# Patient Record
Sex: Female | Born: 1945 | Race: White | Hispanic: No | Marital: Single
Health system: Southern US, Community
[De-identification: ages and names within clinical notes are randomized; demographics above are authoritative.]

---

## 1998-04-26 ENCOUNTER — Encounter: Admission: RE | Admit: 1998-04-26 | Discharge: 1998-07-25 | Payer: Self-pay | Admitting: Family Medicine

## 1998-11-04 ENCOUNTER — Encounter: Admission: RE | Admit: 1998-11-04 | Discharge: 1999-02-02 | Payer: Self-pay | Admitting: Family Medicine

## 2007-07-08 ENCOUNTER — Other Ambulatory Visit: Admission: RE | Admit: 2007-07-08 | Discharge: 2007-07-08 | Payer: Self-pay | Admitting: Family Medicine

## 2007-08-27 ENCOUNTER — Ambulatory Visit: Payer: Self-pay | Admitting: Internal Medicine

## 2007-09-23 ENCOUNTER — Ambulatory Visit: Payer: Self-pay | Admitting: Internal Medicine

## 2007-09-29 ENCOUNTER — Ambulatory Visit: Payer: Self-pay | Admitting: Internal Medicine

## 2011-05-08 NOTE — Letter (Signed)
September 29, 2007    Stacie Acres. Cliffton Asters, M.D.  [address]  [place], [place] [address]   RE:  Shelia, Fuentes  MRN:  [id number]  /  DOB:  [birth year]   Dear Dr. Cliffton Asters:   It was a pleasure to see Shelia Fuentes again in followup today.  As you  know, I saw her on August 27, 2007, for cough.  In the interim, she  states that her cough has completely disappeared.  She states that 2  weeks after visiting with me, her cough disappeared/resolved.  She  thinks this is because she cut down the exposures at her mother's house  by going there less frequently, sleeping there less frequently and also  by getting rid of the dust.  She still states that she is taking the  Symbicort, however.  In terms of dyspnea, she says she is still dyspneic  when climbing a flight of stairs or walking up a hill.   PAST MEDICAL HISTORY:  Unchanged from September 2008.   PAST SURGICAL HISTORY:  Unchanged from September 2008.   CURRENT MEDICATIONS:  Nexium.   ALLERGIES:  No known drug allergies.   REVIEW OF SYSTEMS:  This is positive for a blocked nose on the right  side.  Other than this, she complains of her thumbs hurting which she  thinks is due to her taking Symbicort.  Her appetite is less and she has  lost 10 pounds of weight.  She feels tired all the time and she also has  insomnia.   SOCIAL HISTORY:  Mom's house is cleaner now and she also has less  exposure to the environment in her mom's house.   FAMILY HISTORY:  No change since August 27, 2007.   PHYSICAL EXAMINATION:  VITAL SIGNS:  Today in the clinic, weight 236.8  pounds, temperature 98.4, blood pressure 130/78, pulse 99, pulse  oximetry 98% on room air.  GENERAL:  Exam is essentially unchanged from August 27, 2007.  She is  obese, sitting comfortably in the exam room.  CENTRAL NERVOUS SYSTEM:  Alert and oriented x3.  Speech and ambulation  are normal.  HEENT:  Obese neck, no neck nodes.  Right nose seems congested.  This  was also  noted last time.  LUNGS:  No cough during exam, no crackles.  Normal breath sounds.  ABDOMEN:  Soft, nontender.  CARDIOVASCULAR:  Normal heart sounds.  Regular rate and rhythm.  EXTREMITIES:  No clubbing, cyanosis or edema.  SKIN:  Intact.  BACK:  Normal.   LABORATORY DATA AND X-RAY FINDINGS:  Chest x-ray done today is  essentially normal.  This is mine and Haematologist.  On a 6-  minute walk test, she completed 350 m in 6 minutes.  Pulses oximetry was  98% at rest and was 96% at the end of 6 minutes.  Heart rate went from  102 to 129, blood pressure rose from 130/88 to 150/92.  Fatigue scale  remained at 3, both at rest and exertion.  Dyspnea scale was 0 at rest  and 0.5 at the end of exertion.  She completed the test without any  complaints.   Pulmonary function tests with full set of lung function tests available.  This was done on September 23, 2007.  Pulmonary function test was  essentially normal except for DLCO of 20.4 mL/mmHg per minute which is  77% of predicted, however, the hemoglobin is unavailable for correction.   ASSESSMENT/PLAN:  1. Cough, now resolved.  Looking back, I suspect cough was related to      the exposure at her mom's house.  Cough is now resolved with      continued Symbicort and cutting down the exposure at the mother's      house.  Therefore, I suspect this indeed was an element of cough      variant asthma or reactive airway.  I will ask her to continue to      take the Symbicort for another 6 weeks and then stop it to see if      the cough recurs.  2. Stuffed nose.  I have told her to reinstate taking her nasal      steroids and have given her a sample of Asthmanex.  If it is still      present, I would recommend that you refer her to ENT surgery.  3. Shortness of breath.  She has normal PFTs, normal 6-minute walk      test and no chest pain on exertion.  Therefore, I suspect shortness      of breath is related to obesity and  deconditioning.  I have advised      her to start an exercise program.  4. Positive review of systems.  She has multiple other complaints like      difficulty using her thumbs, weight loss, decreased appetite and      insomnia.  I have asked her to follow up with you for those      complaints.  The only change I made was to start her on Advair      instead of      Symbicort because her thumbs were hurting.  5. Health maintenance.  I gave her flu shot vaccine today.  6. Return to followup.  I told her to come to the clinic if she feels      her symptoms recur or are persistent.  Otherwise, she is discharged      from this clinic.    Sincerely,      Kalman Shan, MD  Electronically Signed    MR/MedQ  DD: 09/29/2007  DT: 09/30/2007  Job #: [id number]

## 2011-05-08 NOTE — Letter (Signed)
August 27, 2007    Stacie Acres. Cliffton Asters, M.D.  [address]  [place], [place] [address]   RE:  Shelia Fuentes, Shelia Fuentes  MRN:  [id number]  /  DOB:  Sep 29, 1946   Dear Dr. Cliffton Asters:   Thank you for referring Shelia Fuentes.  As you know, she is a pleasant  65 year old woman who has persistent cough since December, 2007.  This  cough is present day and night and she says that the cough is so loud  that it has office workers worried and that for the last 3 months it has  a deep tuberculous sound.  She feels she is unable to catch her breath  after cough because the cough is so severe.  The cough is exacerbated by  heat, exercise, walking, climbing one flight of stairs or exertion, is  associated with some dizziness, is generally a dry cough but for the  last few days she has had some scanty yellow sputum.  There is also some  associated wheezing at night.   She denies any upper respiratory illness, viral infection before the  onset of the cough.  She does get dyspneic but usually only after she  coughs.   She denies any fever, postnasal drip, hemoptysis, edema, loss of  consciousness.   PAST MEDICAL HISTORY:  1. Obesity.  2. Low backache.  3. 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   PAST SURGICAL HISTORY:  Status post tonsillectomy at the age of 65.   CURRENT MEDICATIONS:  Symbicort inhaler, Nexium in May, 2008, Flonase  since June, 2008, and prednisone with Levaquin in June, 2008, for the  chronic cough.  Of all of these, only prednisone seems to have worked a  little bit, the rest did not work.  She also had amoxicillin and cough  syrup in March, 2008, but currently she is only on Symbicort since July,  2008.   DRUG ALLERGIES:  No known drug allergies.   SOCIAL HISTORY:  1. She is single.  2. She has never smoked.  3. She lives alone.  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  5. She moved into her mother's old 64 year old in 11-20-2006,      after her  death.  She says she has spent that time trying to clean      the house.  The carpets are all dirty and old, but she denies any      molds or fungus but she states that her cough, her symptoms started      after she moved into this house and symptoms are particularly worse      when she is in her Mom's bedroom.   FAMILY HISTORY:  1. Maternal uncles had heart disease.  2. Father had esophageal cancer.  3. Brother had prostate cancer.   REVIEW OF SYSTEMS:  She admits to a lot of snoring but denies any  excessive daytime somnolence.  She has low backache for a long time with  numbness in the right foot.  Other than that, review of systems is  positive for current respiratory symptoms.  Rest of review of systems  are negative and documented in the questionnaire.   PHYSICAL EXAM:  Weight 247.6 pounds, temperature 97.8, blood pressure  126/80, pulse of 91, pulse of 95% on room air.  EXAM FINDINGS:  This is an obese woman seated comfortably in the exam  room.  She is pleasant.  CENTRAL  NERVOUS SYSTEM:  Alert and oriented x3.  Speech and ambulation  are normal.  HEENT EXAM:  Obese neck but Mallampati Classification is Class 3.  Her  nose seems congested but neck is supple, no neck nodes.  RESPIRATORY EXAM:  She coughs intermittently during the exam.  Air entry  is equal.  There is a possibility of bibasal crackles, but I am not  sure.  ABDOMEN EXAM:  Abdomen is obese, soft, nontender, no organomegaly.  CARDIOVASCULAR:  Normal heart sounds, regular rate and rhythm.  EXTREMITIES:  No cyanosis, no clubbing, no pedal edema.  SKIN:  Skin is intact.  BACK EXAM:  Normal.   LABORATORY EVALUATION:  Pulmonary function test, she has never had one.  Chest x-ray in May, 2008, is reported as normal, it is here with her  today but is of poor quality.   ASSESSMENT AND PLAN:  In summary, this is a 65 year old nonsmoker who  has had a troublesome cough ever since moving in to her Mom's dusty old   house.  Cough is exacerbated by being in the bedroom and by heat and  exercise and some exertion.  It sounds like she cough variant asthma.  Other common etiologies include gastroesophageal reflux disease and  postnasal drip; however, empiric treatment for all of these, including  Nexium, Flonase, prednisone and Symbicort, have not resolved the cough.  The only thing is that prednisone seems to have helped the cough a bit.   I think it is best to establish a cause for the cough.  I will start  with full pulmonary function test with bronchodilator response, DLCO and  also get a high resolution CT scan of the chest.  Will also subject her  to a 6-minute walk test to better clarify the dyspnea.  I will see her  after end of all this evaluation.    Sincerely,      Kalman Shan, MD  Electronically Signed    MR/MedQ  DD: 08/31/2007  DT: 08/31/2007  Job #: [id number]

## 2020-02-11 ENCOUNTER — Emergency Department (HOSPITAL_COMMUNITY): Payer: Medicare Other

## 2020-02-11 ENCOUNTER — Emergency Department (HOSPITAL_COMMUNITY)
Admission: EM | Admit: 2020-02-11 | Discharge: 2020-02-11 | Disposition: A | Discharge status: Home / Self Care | Payer: Medicare Other | Attending: Emergency Medicine | Admitting: Emergency Medicine

## 2020-02-11 ENCOUNTER — Encounter (HOSPITAL_COMMUNITY): Payer: Self-pay

## 2020-02-11 ENCOUNTER — Other Ambulatory Visit: Payer: Self-pay

## 2020-02-11 DIAGNOSIS — W01198A Fall on same level from slipping, tripping and stumbling with subsequent striking against other object, initial encounter: Secondary | ICD-10-CM | POA: Insufficient documentation

## 2020-02-11 DIAGNOSIS — Y9301 Activity, walking, marching and hiking: Secondary | ICD-10-CM | POA: Insufficient documentation

## 2020-02-11 DIAGNOSIS — S42201A Unspecified fracture of upper end of right humerus, initial encounter for closed fracture: Secondary | ICD-10-CM | POA: Diagnosis not present

## 2020-02-11 DIAGNOSIS — Y92017 Garden or yard in single-family (private) house as the place of occurrence of the external cause: Secondary | ICD-10-CM | POA: Insufficient documentation

## 2020-02-11 DIAGNOSIS — Y999 Unspecified external cause status: Secondary | ICD-10-CM | POA: Diagnosis not present

## 2020-02-11 DIAGNOSIS — S4991XA Unspecified injury of right shoulder and upper arm, initial encounter: Secondary | ICD-10-CM | POA: Diagnosis present

## 2020-02-11 MED ORDER — HYDROCODONE-ACETAMINOPHEN 5-325 MG PO TABS
2.0000 | ORAL_TABLET | Freq: Once | ORAL | Status: AC
  Administered 2020-02-11: 21:00:00 2 via ORAL
  Filled 2020-02-11: qty 2

## 2020-02-11 MED ORDER — HYDROCODONE-ACETAMINOPHEN 5-325 MG PO TABS: 2.0000 | ORAL_TABLET | Freq: Four times a day (QID) | ORAL | 0 refills | Status: AC | PRN

## 2020-02-11 MED ORDER — HYDROCODONE-ACETAMINOPHEN 5-325 MG PO TABS: 1.0000 | ORAL_TABLET | Freq: Once | ORAL | Status: DC

## 2020-02-11 NOTE — ED Provider Notes (Signed)
[facility] EMERGENCY DEPARTMENT Provider Note  CSN: [id number] Arrival date & time:        History Chief Complaint  Patient presents with  . Arm Pain   Shelia Fuentes is a 74 y.o. female.  The history is provided by the patient and the EMS personnel.  Fall This is a new problem. The current episode started yesterday (6 PM last night). The problem has not changed since onset.Pertinent negatives include no chest pain, no abdominal pain, no headaches and no shortness of breath. Associated symptoms comments: Right shoulder and upper arm pain. Exacerbated by: Movement, palpation. Relieved by: Immobilization. Treatments tried: 200 mcg of fentanyl via EMS. The treatment provided significant relief.  Patient fell last night at 6 PM, struck her right shoulder on the wall then fell to the ground, without head injury, loss of consciousness, headache, neck pain, nausea, vomiting.  The patient is not on any blood thinners.  Was on the ground for approximately 4 hours before being able to get into the home, friend came to check her this morning, was noted to have significant bruising of the right upper extremity in addition to significant pain so EMS was called for transport to the emergency department.     History reviewed. No pertinent past medical history.  There are no problems to display for this patient.   History reviewed. No pertinent surgical history.   OB History   No obstetric history on file.     History reviewed. No pertinent family history.  Social History   Tobacco Use  . Smoking status: Not on file  Substance Use Topics  . Alcohol use: Not on file  . Drug use: Not on file    Home Medications Prior to Admission medications   Medication Sig Start Date End Date Taking? Authorizing Provider  HYDROcodone-acetaminophen (NORCO) 5-325 MG tablet Take 2 tablets by mouth every 6 (six) hours as needed for severe pain. 02/11/20   Sherwood Gambler, MD     Allergies    Patient has no allergy information on record.  Review of Systems   Review of Systems  Respiratory: Negative for shortness of breath.   Cardiovascular: Negative for chest pain.  Gastrointestinal: Negative for abdominal pain.  Musculoskeletal: Negative for back pain and neck pain.  Neurological: Negative for syncope and headaches.  All other systems reviewed and are negative.   Physical Exam Updated Vital Signs BP 127/68 (BP Location: Left Wrist)   Pulse 79   Temp 98.8 F (37.1 C) (Oral)   Resp 16   Ht 5\' 3"  (1.6 m)   Wt 108.9 kg   SpO2 95%   BMI 42.51 kg/m   Physical Exam Vitals and nursing note reviewed.  Constitutional:      General: She is not in acute distress.    Appearance: She is well-developed.  HENT:     Head: Normocephalic and atraumatic.     Right Ear: Tympanic membrane normal.     Left Ear: Tympanic membrane normal.     Mouth/Throat:     Mouth: Mucous membranes are moist.     Pharynx: Oropharynx is clear.  Eyes:     Conjunctiva/sclera: Conjunctivae normal.     Pupils: Pupils are equal, round, and reactive to light.  Neck:     Comments: No c-spine ttp Cardiovascular:     Rate and Rhythm: Normal rate and regular rhythm.     Pulses: Normal pulses.     Heart sounds: No murmur.  Pulmonary:  Effort: Pulmonary effort is normal. No respiratory distress.     Breath sounds: Normal breath sounds.  Abdominal:     Palpations: Abdomen is soft.     Tenderness: There is no abdominal tenderness.  Musculoskeletal:        General: Tenderness (right upper arm, right clavicle) and deformity (right shoulder) present.     Cervical back: Neck supple.  Skin:    General: Skin is warm and dry.     Findings: Bruising (right upper arm) present.  Neurological:     Mental Status: She is alert.     Sensory: No sensory deficit.     Motor: No weakness.  Psychiatric:        Mood and Affect: Mood normal.        Behavior: Behavior normal.     ED  Results / Procedures / Treatments   Labs (all labs ordered are listed, but only abnormal results are displayed) Labs Reviewed - No data to display  EKG None  Radiology DG Clavicle Right  Result Date: 02/11/2020 CLINICAL DATA:  74 year old female status post fall last night. EXAM: RIGHT CLAVICLE - 2+ VIEWS COMPARISON:  Right humerus series earlier today. FINDINGS: Comminuted proximal right humerus fracture as seen earlier. Right clavicle appears intact. Negative visible right ribs and chest. 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 IMPRESSION: 1. Right clavicle appears intact. 2. Comminuted proximal humerus fracture. Electronically Signed   By: Odessa Fleming M.D.   On: 02/11/2020 18:15   DG Humerus Right  Result Date: 02/11/2020 CLINICAL DATA:  Fall. EXAM: RIGHT HUMERUS - 2+ VIEW COMPARISON:  No prior. FINDINGS: Angulated comminuted fracture of the proximal portion of the right humerus is present. Right shoulder appears to be normally located. No evidence of AC separation. IMPRESSION: Angulated comminuted fracture of the proximal portion of the right humerus. Electronically Signed   By: Maisie Fus  Register   On: 02/11/2020 17:01    Procedures Procedures (including critical care time)  Medications Ordered in ED Medications  HYDROcodone-acetaminophen (NORCO/VICODIN) 5-325 MG per tablet 2 tablet (2 tablets Oral Given 02/11/20 2056)    ED Course  I have reviewed the triage vital signs and the nursing notes.  Pertinent labs & imaging results that were available during my care of the patient were reviewed by me and considered in my medical decision making (see chart for details).    MDM Rules/Calculators/A&P                       Here for right upper arm pain after ground-level fall, mechanical, no syncope.  No head injury, no loss of consciousness, not on blood thinners, no signs of trauma to the head or neck, do not suspect intracranial bleed or cervical fracture at this time  and will defer imaging of these areas.  X-ray with evidence of comminuted angulated right proximal humerus fracture, neuro-motor-vascular intact.  Clavicle x-ray unremarkable, patient able to ambulate, no other signs of trauma on exam.  Fracture discussed with Dr. Carola Frost (ortho) who will see in the clinic next week, placed in sling to the right upper extremity.  Recheck the patient who is having worsening pain, given p.o. Norco x2, will prescribe Norco for pain relief at home.  [facility] [place] prescription drug monitoring program was reviewed.  Strict return precautions given, discharged in stable condition.   Final Clinical Impression(s) / ED Diagnoses Final diagnoses:  Closed fracture of proximal end of right humerus, unspecified fracture morphology, initial encounter  Rx / DC Orders ED Discharge Orders         Ordered    HYDROcodone-acetaminophen (NORCO) 5-325 MG tablet  Every 6 hours PRN     02/11/20 1837           Gargi Berch, [place], MD 02/11/20 2103    Pricilla Loveless, MD 02/11/20 2253

## 2020-02-11 NOTE — Progress Notes (Signed)
Orthopedic Tech Progress Note Patient Details:  Shelia Fuentes May 15, 1946 [id number]  Ortho Devices Type of Ortho Device: Sling immobilizer Ortho Device/Splint Location: rue Ortho Device/Splint Interventions: Ordered, Application, Adjustment   Post Interventions Patient Tolerated: Well Instructions Provided: Care of device, Adjustment of device   Trinna Post 02/11/2020, 8:27 PM

## 2020-02-11 NOTE — ED Triage Notes (Signed)
Pt was "winterizing" house last night when she fell and hit her arm on the side of the building. Took her four hours to get inside, when friend visited her today they decided she should come to ER. Initial BP was 190/100, after fentanyl came down to approximately 140/70 for EMS. All other VSS. AOx4, denies hitting head, LOC, not on thinners.

## 2020-02-11 NOTE — ED Notes (Signed)
Ortho tech paged, will come and apply sling immobilizer

## 2020-02-23 ENCOUNTER — Ambulatory Visit: Payer: Medicare Other | Attending: Orthopedic Surgery | Admitting: Physical Therapy

## 2020-02-23 ENCOUNTER — Other Ambulatory Visit: Payer: Self-pay

## 2020-02-23 ENCOUNTER — Encounter: Payer: Self-pay | Admitting: Physical Therapy

## 2020-02-23 DIAGNOSIS — M25511 Pain in right shoulder: Secondary | ICD-10-CM

## 2020-02-23 DIAGNOSIS — R252 Cramp and spasm: Secondary | ICD-10-CM | POA: Diagnosis present

## 2020-02-23 DIAGNOSIS — M25611 Stiffness of right shoulder, not elsewhere classified: Secondary | ICD-10-CM | POA: Diagnosis present

## 2020-02-23 NOTE — Patient Instructions (Signed)
Access Code: [email or web address]  URL: [email or web address]  Date: 02/23/2020  Prepared by: Stacie Glaze   Exercises Seated Shoulder Shrugs - 10 reps - 2 sets - 3 hold - 3x daily - 7x weekly Seated Scapular Retraction - 10 reps - 2 sets - 3 hold - 2x daily - 7x weekly Seated Shoulder Pendulum Exercise - 10 reps - 1 sets - 1 hold - 3x daily - 7x weekly Seated Forearm Supination Pronation - 10 reps - 1 sets - 2 hold - 2x daily - 7x weekly Seated Shoulder Flexion Towel Slide at Table Top - 10 reps - 2 sets - 3 hold - 2x daily - 7x weekly Seated Biceps Curl - 10 reps - 1 sets - 2 hold - 2x daily - 7x weekly

## 2020-02-23 NOTE — Therapy (Signed)
[facility] [address] [place], [place], [address] Phone: [phone]   Fax:  [phone]  Physical Therapy Evaluation  Patient Details  Name: Shelia Fuentes MRN: [id number] Date of Birth: 14-Jul-1946 Referring Provider (PT): [place]   Encounter Date: 02/23/2020  PT End of Session - 02/23/20 1522    Visit Number  1    Date for PT Re-Evaluation  04/24/20    PT Start Time  7322    PT Stop Time  1523    PT Time Calculation (min)  49 min    Activity Tolerance  Patient limited by pain    Behavior During Therapy  [facility] for tasks assessed/performed       History reviewed. No pertinent past medical history.  History reviewed. No pertinent surgical history.  There were no vitals filed for this visit.   Subjective Assessment - 02/23/20 1437    Subjective  Patient reports that about the 17th of February, she was outside and got tripped up on vines and fell.  The next day she went to th ED.  The x-rays showed a comminuted fracture of the proximal right humerus.  She was put in a sling.    Pertinent History  patient denies    Patient Stated Goals  have normal ROM, strength less difficulty with ADL's    Currently in Pain?  Yes    Pain Score  0-No pain    Pain Location  Arm    Pain Orientation  Right    Pain Descriptors / Indicators  Aching;Sore    Pain Type  Acute pain    Pain Onset  1 to 4 weeks ago    Pain Frequency  Intermittent    Aggravating Factors   movements pain up to 8/10    Pain Relieving Factors  not moving and medication pain can be 0/10    Effect of Pain on Daily Activities  limits everything         [facility] PT Assessment - 02/23/20 0001      Assessment   Medical Diagnosis  s/p right shoulder upper humerus fracture    Referring Provider (PT)  Handy    Onset Date/Surgical Date  02/10/20    Hand Dominance  Right    Prior Therapy  no      Precautions   Precautions  None      Balance Screen   Has the  patient fallen in the past 6 months  Yes    How many times?  1    Has the patient had a decrease in activity level because of a fear of falling?   No    Is the patient reluctant to leave their home because of a fear of falling?   No      Home Environment   Additional Comments  does housework, does some yardwork and gardening      Prior Function   Level of Independence  Independent    Vocation  Retired    Leisure  no exercise in over a year      Mining engineer Comments  fwd head, rounded shoulders, right shoulder elevated and guarded      ROM / Strength   AROM / PROM / Strength  PROM      PROM   Overall PROM Comments  forearm WNL's, elbow extension WNL's flexion is painful and I feel some crepitus in the right shoulder with this 3/10  PROM Assessment Site  Shoulder    Right/Left Shoulder  Right    Right Shoulder Flexion  50 Degrees   pain at end range   Right Shoulder ABduction  30 Degrees    Right Shoulder Internal Rotation  40 Degrees    Right Shoulder External Rotation  0 Degrees   pain     Palpation   Palpation comment  she is very tight and tender in the right upper trap, tender in the right upper arm, she has significant bruising in the right upper arm from mid humerus to the elbow.                Objective measurements completed on examination: See above findings.                PT Short Term Goals - 02/23/20 1527      PT SHORT TERM GOAL #1   Title  independent with initial HEP    Time  2    Period  Weeks    Status  New        PT Long Term Goals - 02/23/20 1527      PT LONG TERM GOAL #1   Title  understand postrure and body mechanics    Time  8    Period  Weeks    Status  New      PT LONG TERM GOAL #2   Title  decrease pain 50%    Time  8    Period  Weeks    Status  New      PT LONG TERM GOAL #3   Title  report able to dress and do hair without difficulty    Time  8    Period  Weeks    Status  New       PT LONG TERM GOAL #4   Title  increase flexion to 120 degrees    Time  8    Period  Weeks    Status  New             Plan - 02/23/20 1523    Clinical Impression Statement  Patient had a fall 02/10/20 and sustained a proximal humerus fracture.  She has been in a sling since that time, she reports that any movements cause pain a 5-6/10, has difficulty getting up from sitting or turning in bed, she is right hand dominant.  She has significant bruising in the right upper arm.  She is tight and tender in the right upper trap from guarding.  Forearm and wrist motions are WNL's, when we did biceps curls this caused pain and there was some crepitus in the right shoulder.  PROM was very limited with pain, flexion was better than other motions but still very limited    Stability/Clinical Decision Making  Evolving/Moderate complexity    Clinical Decision Making  Low    Rehab Potential  Good    PT Frequency  2x / week    PT Duration  8 weeks    PT Treatment/Interventions  ADLs/Self Care Home Management;Cryotherapy;Electrical Stimulation;Moist Heat;Therapeutic activities;Therapeutic exercise;Manual techniques;Patient/family education    PT Next Visit Plan  I gave HEPbut told her to go slow until she seems MD tomorrow, she has a lot of crepitus with motions so I do have concerns about RC damage and cartilage damage    Consulted and Agree with Plan of Care  Patient       Patient will benefit from skilled therapeutic intervention  in order to improve the following deficits and impairments:  Pain, Improper body mechanics, Increased muscle spasms, Postural dysfunction, Impaired UE functional use, Decreased strength, Decreased range of motion, Decreased activity tolerance  Visit Diagnosis: Acute pain of right shoulder - Plan: PT plan of care cert/re-cert  Stiffness of right shoulder, not elsewhere classified - Plan: PT plan of care cert/re-cert  Cramp and spasm - Plan: PT plan of care  cert/re-cert     Problem List There are no problems to display for this patient.   Jearld Lesch., PT 02/23/2020, 3:30 PM  [facility] [address] [facility] [address] [place], [place], [address] Phone: [phone]   Fax:  [phone]  Name: Shelia Fuentes MRN: [id number] Date of Birth: [birth year]

## 2020-03-04 ENCOUNTER — Ambulatory Visit: Payer: Medicare Other | Admitting: Physical Therapy

## 2020-03-04 ENCOUNTER — Other Ambulatory Visit: Payer: Self-pay

## 2020-03-04 DIAGNOSIS — M25611 Stiffness of right shoulder, not elsewhere classified: Secondary | ICD-10-CM

## 2020-03-04 DIAGNOSIS — M25511 Pain in right shoulder: Secondary | ICD-10-CM

## 2020-03-04 DIAGNOSIS — R252 Cramp and spasm: Secondary | ICD-10-CM

## 2020-03-04 NOTE — Therapy (Signed)
[place] [address] [place], [place], [address] Phone: [phone]   Fax:  [phone]  Physical Therapy Treatment  Patient Details  Name: Shelia Fuentes MRN: [id number] Date of Birth: [birth year] Referring Provider (PT): Marcelino Scot   Encounter Date: 03/04/2020  PT End of Session - 03/04/20 1133    Visit Number  2    Date for PT Re-Evaluation  04/24/20    PT Start Time  1053    PT Stop Time  1761    PT Time Calculation (min)  38 min       No past medical history on file.  No past surgical history on file.  There were no vitals filed for this visit.  Subjective Assessment - 03/04/20 1128    Subjective  saw MD he said it was healing, f/u 3/17    Currently in Pain?  Yes    Pain Score  5     Pain Location  Shoulder    Pain Orientation  Right                       [facility] Adult PT Treatment/Exercise - 03/04/20 0001      Exercises   Exercises  Shoulder;Neck      Shoulder Exercises: Standing   Other Standing Exercises  finger ladder 5 x flex PTA assist   ball rolling on mat PTA assist   Other Standing Exercises  bicep curl, IR and shld ext with cane 10 x   shruggs and backward rolls 10 each     Shoulder Exercises: ROM/Strengthening   UBE (Upper Arm Bike)  10 x fwd 10x back with PTA assist      Manual Therapy   Manual Therapy  Passive ROM    Manual therapy comments  very guarded and resisted    Passive ROM  to pt tolerance and educ pt friend on gentle PROM at home             PT Education - 03/04/20 1132    Education Details  PROM with friend, standing shruggs,backward rolls and bicep curls. educ in relaxation on trap and letting arm hang at side    Person(s) Educated  Patient    Methods  Explanation    Comprehension  Verbalized understanding;Returned demonstration;Tactile cues required       PT Short Term Goals - 02/23/20 1527      PT SHORT TERM GOAL #1   Title  independent with  initial HEP    Time  2    Period  Weeks    Status  New        PT Long Term Goals - 02/23/20 1527      PT LONG TERM GOAL #1   Title  understand postrure and body mechanics    Time  8    Period  Weeks    Status  New      PT LONG TERM GOAL #2   Title  decrease pain 50%    Time  8    Period  Weeks    Status  New      PT LONG TERM GOAL #3   Title  report able to dress and do hair without difficulty    Time  8    Period  Weeks    Status  New      PT LONG TERM GOAL #4   Title  increase flexion to 120 degrees  Time  8    Period  Weeks    Status  New            Plan - 03/04/20 1134    Clinical Impression Statement  pt tolerated initial ther ex fait, PTA and cuing needed as pt is guarded and pain limited. educ friend in PROM at home, increased HEP and educ pt in relating trap as sh etends to hike shld in quarded position    PT Treatment/Interventions  ADLs/Self Care Home Management;Cryotherapy;Electrical Stimulation;Moist Heat;Therapeutic activities;Therapeutic exercise;Manual techniques;Patient/family education    PT Next Visit Plan  check HEP, PROM and MD f/u 03/09/20       Patient will benefit from skilled therapeutic intervention in order to improve the following deficits and impairments:  Pain, Improper body mechanics, Increased muscle spasms, Postural dysfunction, Impaired UE functional use, Decreased strength, Decreased range of motion, Decreased activity tolerance  Visit Diagnosis: Stiffness of right shoulder, not elsewhere classified  Acute pain of right shoulder  Cramp and spasm     Problem List There are no problems to display for this patient.   Keona Bilyeu,ANGIE PTA 03/04/2020, 11:35 AM  [facility] [address] [place], [place], [address] Phone: [phone]   Fax:  [phone]  Name: Everlee Quakenbush MRN: [id number] Date of Birth: February 19, 1946

## 2020-03-11 ENCOUNTER — Ambulatory Visit: Payer: Medicare Other | Admitting: Physical Therapy

## 2020-03-11 ENCOUNTER — Other Ambulatory Visit: Payer: Self-pay

## 2020-03-11 DIAGNOSIS — M25511 Pain in right shoulder: Secondary | ICD-10-CM | POA: Diagnosis not present

## 2020-03-11 DIAGNOSIS — R252 Cramp and spasm: Secondary | ICD-10-CM

## 2020-03-11 DIAGNOSIS — M25611 Stiffness of right shoulder, not elsewhere classified: Secondary | ICD-10-CM

## 2020-03-11 NOTE — Therapy (Signed)
[place] [address] [place], [place], [address] Phone: [phone]   Fax:  [phone]  Physical Therapy Treatment  Patient Details  Name: Shelia Fuentes MRN: [id number] Date of Birth: 10-25-1946 Referring Provider (PT): Marcelino Scot   Encounter Date: 03/11/2020  PT End of Session - 03/11/20 1044    Visit Number  3    Date for PT Re-Evaluation  04/24/20    PT Start Time  1010    PT Stop Time  1045    PT Time Calculation (min)  35 min       No past medical history on file.  No past surgical history on file.  There were no vitals filed for this visit.  Subjective Assessment - 03/11/20 1014    Subjective  saew MD and he was pleased ,bon egrowing, D?C sling- however pt self slings    Currently in Pain?  Yes    Pain Score  2     Pain Location  Shoulder    Pain Orientation  Right                       [facility] Adult PT Treatment/Exercise - 03/11/20 0001      Neck Exercises: Machines for Strengthening   UBE (Upper Arm Bike)  1 min fwd /L 1 min back      Shoulder Exercises: Supine   Other Supine Exercises  cane ex with PTA assist      Shoulder Exercises: Standing   Other Standing Exercises  flex,ext and abd on counter 10 each      Manual Therapy   Manual Therapy  Passive ROM    Passive ROM  to pt tolerance and with cuing to relax, some popping             PT Education - 03/11/20 1043    Education Details  counter top sliding, cane ex supine, relaxing bicep and vs slef slinging    Person(s) Educated  Patient    Methods  Explanation;Demonstration    Comprehension  Verbalized understanding;Returned demonstration       PT Short Term Goals - 03/11/20 1046      PT SHORT TERM GOAL #1   Title  independent with initial HEP    Status  Achieved        PT Long Term Goals - 02/23/20 1527      PT LONG TERM GOAL #1   Title  understand postrure and body mechanics    Time  8    Period  Weeks     Status  New      PT LONG TERM GOAL #2   Title  decrease pain 50%    Time  8    Period  Weeks    Status  New      PT LONG TERM GOAL #3   Title  report able to dress and do hair without difficulty    Time  8    Period  Weeks    Status  New      PT LONG TERM GOAL #4   Title  increase flexion to 120 degrees    Time  8    Period  Weeks    Status  New            Plan - 03/11/20 1044    Clinical Impression Statement  pt still very guarded but did allow increased PROM with cuing, some popping in  shld noted but pt denies pain. added to HEP and encouraged letting arm hang to side. STG met    PT Treatment/Interventions  ADLs/Self Care Home Management;Cryotherapy;Electrical Stimulation;Moist Heat;Therapeutic activities;Therapeutic exercise;Manual techniques;Patient/family education    PT Next Visit Plan  progress       Patient will benefit from skilled therapeutic intervention in order to improve the following deficits and impairments:  Pain, Improper body mechanics, Increased muscle spasms, Postural dysfunction, Impaired UE functional use, Decreased strength, Decreased range of motion, Decreased activity tolerance  Visit Diagnosis: Acute pain of right shoulder  Cramp and spasm  Stiffness of right shoulder, not elsewhere classified     Problem List There are no problems to display for this patient.   Avon Molock,ANGIE PTA 03/11/2020, 10:47 AM  [place], [place], [address] Phone: [phone]   Fax:  [phone]  Name: Shelia Fuentes MRN: [id number] Date of Birth: 02-12-1946

## 2020-03-15 ENCOUNTER — Ambulatory Visit: Payer: Medicare Other | Admitting: Physical Therapy

## 2020-03-15 ENCOUNTER — Other Ambulatory Visit: Payer: Self-pay

## 2020-03-15 DIAGNOSIS — R252 Cramp and spasm: Secondary | ICD-10-CM

## 2020-03-15 DIAGNOSIS — M25511 Pain in right shoulder: Secondary | ICD-10-CM

## 2020-03-15 DIAGNOSIS — M25611 Stiffness of right shoulder, not elsewhere classified: Secondary | ICD-10-CM

## 2020-03-15 NOTE — Therapy (Signed)
[place], [place], [address] Phone: [phone]   Fax:  [phone]  Physical Therapy Treatment  Patient Details  Name: Shelia Fuentes MRN: [id number] Date of Birth: 1946-12-14 Referring Provider (PT): Marcelino Scot   Encounter Date: 03/15/2020  PT End of Session - 03/15/20 1409    Visit Number  4    Date for PT Re-Evaluation  04/24/20    PT Start Time  2637    PT Stop Time  1350    PT Time Calculation (min)  35 min       No past medical history on file.  No past surgical history on file.  There were no vitals filed for this visit.  Subjective Assessment - 03/15/20 1313    Subjective  amb in without sling, c/o upper arm pain    Currently in Pain?  Yes    Pain Score  4     Pain Location  Shoulder         [facility] PT Assessment - 03/15/20 0001      PROM   PROM Assessment Site  Shoulder    Right/Left Shoulder  Right    Right Shoulder Flexion  98 Degrees    Right Shoulder ABduction  100 Degrees    Right Shoulder Internal Rotation  80 Degrees   at 40 degree abd   Right Shoulder External Rotation  20 Degrees   at 30 degrees abd                  [facility] Adult PT Treatment/Exercise - 03/15/20 0001      Neck Exercises: Machines for Strengthening   UBE (Upper Arm Bike)  2 min fwd /L 1 min back      Neck Exercises: Standing   Other Standing Exercises  finger ladder with PTA assit 5 x   rolling ball on mat   Other Standing Exercises  cane ex PTA assist      Manual Therapy   Manual Therapy  Passive ROM    Manual therapy comments  less resistance    Passive ROM  all directions               PT Short Term Goals - 03/11/20 1046      PT SHORT TERM GOAL #1   Title  independent with initial HEP    Status  Achieved        PT Long Term Goals - 02/23/20 1527      PT LONG TERM GOAL #1   Title  understand postrure and body mechanics    Time  8    Period  Weeks    Status   New      PT LONG TERM GOAL #2   Title  decrease pain 50%    Time  8    Period  Weeks    Status  New      PT LONG TERM GOAL #3   Title  report able to dress and do hair without difficulty    Time  8    Period  Weeks    Status  New      PT LONG TERM GOAL #4   Title  increase flexion to 120 degrees    Time  8    Period  Weeks    Status  New            Plan - 03/15/20 1410    Clinical Impression Statement  improved PROM and less resistanc eto ROM. toelrated ther ex fair but does need assistance. c/o RT upper arm pain through session but only with mvmt    PT Treatment/Interventions  ADLs/Self Care Home Management;Cryotherapy;Electrical Stimulation;Moist Heat;Therapeutic activities;Therapeutic exercise;Manual techniques;Patient/family education    PT Next Visit Plan  progress as tolerated       Patient will benefit from skilled therapeutic intervention in order to improve the following deficits and impairments:  Pain, Improper body mechanics, Increased muscle spasms, Postural dysfunction, Impaired UE functional use, Decreased strength, Decreased range of motion, Decreased activity tolerance  Visit Diagnosis: Acute pain of right shoulder  Stiffness of right shoulder, not elsewhere classified  Cramp and spasm     Problem List There are no problems to display for this patient.   Laymon Stockert,ANGIE PTA 03/15/2020, 2:12 PM  [facility] [address] [facility] [facility] [address] [place], [place], [address] Phone: [phone]   Fax:  [phone]  Name: Shelia Fuentes MRN: [id number] Date of Birth: 01/02/1946

## 2020-03-17 ENCOUNTER — Other Ambulatory Visit: Payer: Self-pay

## 2020-03-17 ENCOUNTER — Ambulatory Visit: Payer: Medicare Other | Admitting: Physical Therapy

## 2020-03-17 DIAGNOSIS — M25611 Stiffness of right shoulder, not elsewhere classified: Secondary | ICD-10-CM

## 2020-03-17 DIAGNOSIS — M25511 Pain in right shoulder: Secondary | ICD-10-CM | POA: Diagnosis not present

## 2020-03-17 NOTE — Therapy (Signed)
[place], [place], [address] Phone: [phone]   Fax:  [phone]  Physical Therapy Treatment  Patient Details  Name: Shelia Fuentes MRN: [id number] Date of Birth: 23-Nov-1946 Referring Provider (PT): Marcelino Scot   Encounter Date: 03/17/2020  PT End of Session - 03/17/20 1138    Visit Number  5    Date for PT Re-Evaluation  04/24/20    PT Start Time  1055    PT Stop Time  1140    PT Time Calculation (min)  45 min       No past medical history on file.  No past surgical history on file.  There were no vitals filed for this visit.  Subjective Assessment - 03/17/20 1055    Subjective  doing better    Currently in Pain?  Yes    Pain Score  3     Pain Location  Shoulder    Pain Orientation  Right                       [facility] Adult PT Treatment/Exercise - 03/17/20 0001      Neck Exercises: Machines for Strengthening   UBE (Upper Arm Bike)  L 1 2 fwd/2 back    Nustep  L 2 4 min UE only      Shoulder Exercises: Standing   Extension  Strengthening;Both;20 reps;Theraband    Theraband Level (Shoulder Extension)  Level 1 (Yellow)    Row  Strengthening;Both;20 reps;Theraband    Theraband Level (Shoulder Row)  Level 1 (Yellow)    Other Standing Exercises  yellow tband bicep curl 2 sets 10   cane ex standing   Other Standing Exercises  3# shruggs and backward rolls 15 each      Shoulder Exercises: Pulleys   Flexion  2 minutes   PTA assist for increased ROM   ABduction  2 minutes   PTA assist for position     Manual Therapy   Manual Therapy  Passive ROM    Manual therapy comments  less resistance with increased passive mvmt    Passive ROM  all directions               PT Short Term Goals - 03/11/20 1046      PT SHORT TERM GOAL #1   Title  independent with initial HEP    Status  Achieved        PT Long Term Goals - 02/23/20 1527      PT LONG TERM GOAL #1   Title  understand postrure and body mechanics    Time  8    Period  Weeks    Status  New      PT LONG TERM GOAL #2   Title  decrease pain 50%    Time  8    Period  Weeks    Status  New      PT LONG TERM GOAL #3   Title  report able to dress and do hair without difficulty    Time  8    Period  Weeks    Status  New      PT LONG TERM GOAL #4   Title  increase flexion to 120 degrees    Time  8    Period  Weeks    Status  New            Plan - 03/17/20 1139  Clinical Impression Statement  increased ex today and added more AA ex, increased ROM but weakness noted. cuing needed with ex to decrease compensation and activate correct muscles for mvmt    PT Treatment/Interventions  ADLs/Self Care Home Management;Cryotherapy;Electrical Stimulation;Moist Heat;Therapeutic activities;Therapeutic exercise;Manual techniques;Patient/family education    PT Next Visit Plan  progress as tolerated       Patient will benefit from skilled therapeutic intervention in order to improve the following deficits and impairments:  Pain, Improper body mechanics, Increased muscle spasms, Postural dysfunction, Impaired UE functional use, Decreased strength, Decreased range of motion, Decreased activity tolerance  Visit Diagnosis: Acute pain of right shoulder  Stiffness of right shoulder, not elsewhere classified     Problem List There are no problems to display for this patient.   Sahan Pen,ANGIE  PTA 03/17/2020, 11:41 AM  [facility] [address] [facility] [address] [place], [place], [address] Phone: [phone]   Fax:  [phone]  Name: Shelia Fuentes MRN: [id number] Date of Birth: [birth year]

## 2020-03-22 ENCOUNTER — Other Ambulatory Visit: Payer: Self-pay

## 2020-03-22 ENCOUNTER — Ambulatory Visit: Payer: Medicare Other | Admitting: Physical Therapy

## 2020-03-22 DIAGNOSIS — M25511 Pain in right shoulder: Secondary | ICD-10-CM

## 2020-03-22 DIAGNOSIS — M25611 Stiffness of right shoulder, not elsewhere classified: Secondary | ICD-10-CM

## 2020-03-22 DIAGNOSIS — R252 Cramp and spasm: Secondary | ICD-10-CM

## 2020-03-22 NOTE — Therapy (Signed)
[place], [place], [address] Phone: [phone]   Fax:  [phone]  Physical Therapy Treatment  Patient Details  Name: Shelia Fuentes MRN: [id number] Date of Birth: Mar 06, 1946 Referring Provider (PT): Marcelino Scot   Encounter Date: 03/22/2020  PT End of Session - 03/22/20 1351    Visit Number  6    Date for PT Re-Evaluation  04/24/20    PT Start Time  1310    PT Stop Time  7782    PT Time Calculation (min)  42 min       No past medical history on file.  No past surgical history on file.  There were no vitals filed for this visit.  Subjective Assessment - 03/22/20 1313    Subjective  alittle sore on top of shld. MD 4/7    Currently in Pain?  Yes    Pain Score  2     Pain Location  Shoulder    Pain Orientation  Right                       [facility] Adult PT Treatment/Exercise - 03/22/20 0001      Shoulder Exercises: Seated   Other Seated Exercises  isometrics all directions 15 x hold 3 sec    Other Seated Exercises  AAROM all directions with PTA assist 10 times      Shoulder Exercises: Standing   Extension  Strengthening;Both;20 reps;Theraband    Theraband Level (Shoulder Extension)  Level 2 (Red)    Row  Strengthening;Both;20 reps;Theraband    Theraband Level (Shoulder Row)  Level 2 (Red)    Other Standing Exercises  red tband bicep curl 2 sets 10   finger ladder flex and abd 8 x each   Other Standing Exercises  1 level cabinet reaching some PTA assist   cone stacking on counter. cane ex 15 each standing     Shoulder Exercises: ROM/Strengthening   UBE (Upper Arm Bike)  3 min fwd/3 min backward L 3    Lat Pull  20 reps   15# for ROM, PTA to pull down     Manual Therapy   Manual Therapy  Passive ROM;Joint mobilization    Joint Mobilization  very gentle in left shld    Passive ROM  all directions               PT Short Term Goals - 03/11/20 1046      PT SHORT  TERM GOAL #1   Title  independent with initial HEP    Status  Achieved        PT Long Term Goals - 02/23/20 1527      PT LONG TERM GOAL #1   Title  understand postrure and body mechanics    Time  8    Period  Weeks    Status  New      PT LONG TERM GOAL #2   Title  decrease pain 50%    Time  8    Period  Weeks    Status  New      PT LONG TERM GOAL #3   Title  report able to dress and do hair without difficulty    Time  8    Period  Weeks    Status  New      PT LONG TERM GOAL #4   Title  increase flexion to 120 degrees  Time  8    Period  Weeks    Status  New            Plan - 03/22/20 1352    Clinical Impression Statement  increased Act and AA ex, AA with PTA assist to increase ROM as pt is very weak,PROM is getting better and able to relax better.    PT Treatment/Interventions  ADLs/Self Care Home Management;Cryotherapy;Electrical Stimulation;Moist Heat;Therapeutic activities;Therapeutic exercise;Manual techniques;Patient/family education    PT Next Visit Plan  check goals , increase HEP       Patient will benefit from skilled therapeutic intervention in order to improve the following deficits and impairments:  Pain, Improper body mechanics, Increased muscle spasms, Postural dysfunction, Impaired UE functional use, Decreased strength, Decreased range of motion, Decreased activity tolerance  Visit Diagnosis: Stiffness of right shoulder, not elsewhere classified  Acute pain of right shoulder  Cramp and spasm     Problem List There are no problems to display for this patient.   Ryshawn Sanzone,ANGIE PTA 03/22/2020, 1:53 PM  [facility] [address] [facility] [address] [place], [place], [address] Phone: [phone]   Fax:  [phone]  Name: Shelia Fuentes MRN: [id number] Date of Birth: 21-May-1946

## 2020-03-24 ENCOUNTER — Other Ambulatory Visit: Payer: Self-pay

## 2020-03-24 ENCOUNTER — Ambulatory Visit: Payer: Medicare Other | Attending: Orthopedic Surgery | Admitting: Physical Therapy

## 2020-03-24 DIAGNOSIS — M25611 Stiffness of right shoulder, not elsewhere classified: Secondary | ICD-10-CM | POA: Diagnosis not present

## 2020-03-24 DIAGNOSIS — M25511 Pain in right shoulder: Secondary | ICD-10-CM | POA: Diagnosis present

## 2020-03-24 DIAGNOSIS — R252 Cramp and spasm: Secondary | ICD-10-CM | POA: Diagnosis present

## 2020-03-24 NOTE — Therapy (Signed)
[place] [address] [place], [place], [address] Phone: [phone]   Fax:  [phone]  Physical Therapy Treatment  Patient Details  Name: Shelia Fuentes MRN: [id number] Date of Birth: August 21, 1946 Referring Provider (PT): Marcelino Scot   Encounter Date: 03/24/2020  PT End of Session - 03/24/20 1617    Visit Number  7    Date for PT Re-Evaluation  04/24/20    PT Start Time  1525    PT Stop Time  1612    PT Time Calculation (min)  47 min       No past medical history on file.  No past surgical history on file.  There were no vitals filed for this visit.  Subjective Assessment - 03/24/20 1540    Subjective  very sore after last session    Currently in Pain?  Yes    Pain Score  1     Pain Location  Shoulder    Pain Orientation  Right         [facility] PT Assessment - 03/24/20 0001      ROM / Strength   AROM / PROM / Strength  AROM      AROM   Overall AROM Comments  85 Rt shld flex supine      PROM   PROM Assessment Site  Shoulder    Right/Left Shoulder  Right    Right Shoulder Flexion  150 Degrees    Right Shoulder ABduction  138 Degrees    Right Shoulder Internal Rotation  80 Degrees    Right Shoulder External Rotation  60 Degrees                   [facility] Adult PT Treatment/Exercise - 03/24/20 0001      Shoulder Exercises: Supine   Flexion  AROM;AAROM;Strengthening;Right;20 reps      Shoulder Exercises: Standing   Extension  Strengthening;Both;20 reps;Theraband    Theraband Level (Shoulder Extension)  Level 2 (Red)    Row  Strengthening;Both;20 reps;Theraband    Theraband Level (Shoulder Row)  Level 2 (Red)    Other Standing Exercises  red tband bicep curl 2 sets 10   finger ladder flex and abd 8x   Other Standing Exercises  RT UE ball on mat rolling   cane ex ext and IR 2# 15, flex and chest press 15 min A     Manual Therapy   Manual Therapy  Passive ROM;Joint mobilization    Passive ROM   all directions             PT Education - 03/24/20 1615    Education Details  supine flexion, cane ex, scap stab    Person(s) Educated  Patient    Methods  Explanation;Demonstration;Handout    Comprehension  Verbalized understanding;Returned demonstration       PT Short Term Goals - 03/11/20 1046      PT SHORT TERM GOAL #1   Title  independent with initial HEP    Status  Achieved        PT Long Term Goals - 03/24/20 1618      PT LONG TERM GOAL #1   Title  understand postrure and body mechanics    Status  Partially Met      PT LONG TERM GOAL #2   Title  decrease pain 50%    Status  Partially Met      PT LONG TERM GOAL #3   Title  report able to dress and do hair without difficulty    Status  Partially Met      PT LONG TERM GOAL #4   Title  increase flexion to 120 degrees    Status  Partially Met            Plan - 03/24/20 1617    Clinical Impression Statement  progressing with goals. progressing with PROM-very weak so AROM is limited. added to HEP. PTA cuing and assist with ex    PT Treatment/Interventions  ADLs/Self Care Home Management;Cryotherapy;Electrical Stimulation;Moist Heat;Therapeutic activities;Therapeutic exercise;Manual techniques;Patient/family education    PT Next Visit Plan  progress       Patient will benefit from skilled therapeutic intervention in order to improve the following deficits and impairments:  Pain, Improper body mechanics, Increased muscle spasms, Postural dysfunction, Impaired UE functional use, Decreased strength, Decreased range of motion, Decreased activity tolerance  Visit Diagnosis: Stiffness of right shoulder, not elsewhere classified  Acute pain of right shoulder  Cramp and spasm     Problem List There are no problems to display for this patient.   Shelia Fuentes,ANGIE PTA 03/24/2020, 4:19 PM  [place], [place],  [address] Phone: [phone]   Fax:  [phone]  Name: Shelia Fuentes MRN: [id number] Date of Birth: Nov 05, 1946

## 2020-03-29 ENCOUNTER — Ambulatory Visit: Payer: Medicare Other | Admitting: Physical Therapy

## 2020-03-29 ENCOUNTER — Other Ambulatory Visit: Payer: Self-pay

## 2020-03-29 DIAGNOSIS — M25611 Stiffness of right shoulder, not elsewhere classified: Secondary | ICD-10-CM | POA: Diagnosis not present

## 2020-03-29 DIAGNOSIS — M25511 Pain in right shoulder: Secondary | ICD-10-CM

## 2020-03-29 NOTE — Therapy (Signed)
[address] [place], [place], [address] Phone: [phone]   Fax:  [phone]  Physical Therapy Treatment  Patient Details  Name: Shelia Fuentes MRN: [id number] Date of Birth: [birth year] Referring Provider (PT): Marcelino Scot   Encounter Date: 03/29/2020  PT End of Session - 03/29/20 1356    Visit Number  8    Date for PT Re-Evaluation  04/24/20    PT Start Time  4158    PT Stop Time  3094    PT Time Calculation (min)  40 min       No past medical history on file.  No past surgical history on file.  There were no vitals filed for this visit.  Subjective Assessment - 03/29/20 1320    Subjective  drove today. arm sore after last session but okay    Currently in Pain?  Yes    Pain Score  2     Pain Location  Shoulder    Pain Orientation  Right                       [facility] Adult PT Treatment/Exercise - 03/29/20 0001      Neck Exercises: Machines for Strengthening   UBE (Upper Arm Bike)  L 2 3 fwd/3 back      Shoulder Exercises: Supine   Other Supine Exercises  Act/AA and stab with mvmt      Shoulder Exercises: Standing   Other Standing Exercises  wall washing 10 x 4 ways -min PTA A   rolling ball up and down wall 10x   Other Standing Exercises  wall push ups with ball 10x   1st shelf cabinet raises     Shoulder Exercises: ROM/Strengthening   Other ROM/Strengthening Exercises  tricep ex 15# 2 sets 10   bicep curl 10# 2 sets 10   Other ROM/Strengthening Exercises  row 15# pulleys 2 sets 10      Manual Therapy   Manual Therapy  Passive ROM    Passive ROM  all directions               PT Short Term Goals - 03/11/20 1046      PT SHORT TERM GOAL #1   Title  independent with initial HEP    Status  Achieved        PT Long Term Goals - 03/24/20 1618      PT LONG TERM GOAL #1   Title  understand postrure and body mechanics    Status  Partially Met      PT LONG TERM GOAL  #2   Title  decrease pain 50%    Status  Partially Met      PT LONG TERM GOAL #3   Title  report able to dress and do hair without difficulty    Status  Partially Met      PT LONG TERM GOAL #4   Title  increase flexion to 120 degrees    Status  Partially Met            Plan - 03/29/20 1356    Clinical Impression Statement  pt with improved PROM but weakness so decreased AROM , with min A support can increase activity in higher ROM. cuing needed to decrease compensation with mvmt    PT Treatment/Interventions  ADLs/Self Care Home Management;Cryotherapy;Electrical Stimulation;Moist Heat;Therapeutic activities;Therapeutic exercise;Manual techniques;Patient/family education    PT Next Visit Plan  assess and  progress       Patient will benefit from skilled therapeutic intervention in order to improve the following deficits and impairments:  Pain, Improper body mechanics, Increased muscle spasms, Postural dysfunction, Impaired UE functional use, Decreased strength, Decreased range of motion, Decreased activity tolerance  Visit Diagnosis: Stiffness of right shoulder, not elsewhere classified  Acute pain of right shoulder     Problem List There are no problems to display for this patient.   Ireoluwa Gorsline,ANGIE PTA 03/29/2020, 1:58 PM  [place] [address] [place], [place], [address] Phone: [phone]   Fax:  [phone]  Name: Adajah Cocking MRN: [id number] Date of Birth: Sep 18, 1946

## 2020-03-31 ENCOUNTER — Other Ambulatory Visit: Payer: Self-pay

## 2020-03-31 ENCOUNTER — Ambulatory Visit: Payer: Medicare Other | Admitting: Physical Therapy

## 2020-03-31 DIAGNOSIS — M25611 Stiffness of right shoulder, not elsewhere classified: Secondary | ICD-10-CM

## 2020-03-31 DIAGNOSIS — M25511 Pain in right shoulder: Secondary | ICD-10-CM

## 2020-03-31 NOTE — Therapy (Signed)
[place], [place], [address] Phone: [phone]   Fax:  [phone]  Physical Therapy Treatment  Patient Details  Name: Shelia Fuentes MRN: [id number] Date of Birth: 01-04-1946 Referring Provider (PT): Marcelino Scot   Encounter Date: 03/31/2020  PT End of Session - 03/31/20 1454    Visit Number  9    Date for PT Re-Evaluation  04/24/20    PT Start Time  1400    PT Stop Time  1440    PT Time Calculation (min)  40 min       No past medical history on file.  No past surgical history on file.  There were no vitals filed for this visit.  Subjective Assessment - 03/31/20 1356    Subjective  doing okay, MD pleased- saw some bone growth- f/u in a month.  definately feel like I am using it more. driving is better    Currently in Pain?  No/denies         [facility] PT Assessment - 03/31/20 0001      AROM   Overall AROM Comments  100 Rt shld flex supine      PROM   PROM Assessment Site  Shoulder    Right/Left Shoulder  Right    Right Shoulder Flexion  170 Degrees    Right Shoulder ABduction  155 Degrees                   [facility] Adult PT Treatment/Exercise - 03/31/20 0001      Neck Exercises: Machines for Strengthening   UBE (Upper Arm Bike)  L 2 3 fwd/3 back      Shoulder Exercises: Supine   Other Supine Exercises  Act/AA and stab with mvmt      Shoulder Exercises: Standing   Other Standing Exercises  rolling ball up and down wall 2 set s10 with assistance to increase ROM   rolling ball side to side 10 x, circles CW and CCW   Other Standing Exercises  1 level cabinet func reaching      Shoulder Exercises: ROM/Strengthening   Other ROM/Strengthening Exercises  tricep ex 15# 2 sets 15   bicep curl 10# 2 sets 15   Other ROM/Strengthening Exercises  row 15# pulleys 2 sets 15      Manual Therapy   Manual Therapy  Passive ROM    Passive ROM  all directions               PT  Short Term Goals - 03/11/20 1046      PT SHORT TERM GOAL #1   Title  independent with initial HEP    Status  Achieved        PT Long Term Goals - 03/24/20 1618      PT LONG TERM GOAL #1   Title  understand postrure and body mechanics    Status  Partially Met      PT LONG TERM GOAL #2   Title  decrease pain 50%    Status  Partially Met      PT LONG TERM GOAL #3   Title  report able to dress and do hair without difficulty    Status  Partially Met      PT LONG TERM GOAL #4   Title  increase flexion to 120 degrees    Status  Partially Met            Plan - 03/31/20  1454    Clinical Impression Statement  PROM and AROM increasing, strength is slowly increasing but needs verb and tactile cuing for correct tech and fatigues quickly. progressing with goals    PT Treatment/Interventions  ADLs/Self Care Home Management;Cryotherapy;Electrical Stimulation;Moist Heat;Therapeutic activities;Therapeutic exercise;Manual techniques;Patient/family education    PT Next Visit Plan  MD pleased ROM and strength slowly inceasing       Patient will benefit from skilled therapeutic intervention in order to improve the following deficits and impairments:  Pain, Improper body mechanics, Increased muscle spasms, Postural dysfunction, Impaired UE functional use, Decreased strength, Decreased range of motion, Decreased activity tolerance  Visit Diagnosis: Acute pain of right shoulder  Stiffness of right shoulder, not elsewhere classified     Problem List There are no problems to display for this patient.   Stacia Feazell,ANGIE PTA 03/31/2020, 2:56 PM  [place] [address] [place], [place], [address] Phone: [phone]   Fax:  [phone]  Name: Shelia Fuentes MRN: [id number] Date of Birth: March 27, 1946

## 2020-04-05 ENCOUNTER — Ambulatory Visit: Payer: Medicare Other | Admitting: Physical Therapy

## 2020-04-05 ENCOUNTER — Other Ambulatory Visit: Payer: Self-pay

## 2020-04-05 DIAGNOSIS — M25611 Stiffness of right shoulder, not elsewhere classified: Secondary | ICD-10-CM

## 2020-04-05 DIAGNOSIS — M25511 Pain in right shoulder: Secondary | ICD-10-CM

## 2020-04-05 DIAGNOSIS — R252 Cramp and spasm: Secondary | ICD-10-CM

## 2020-04-05 NOTE — Therapy (Signed)
[place] [address] [place], [place], [address] Phone: [phone]   Fax:  [phone]  Physical Therapy Treatment  Patient Details  Name: Shelia Fuentes MRN: [id number] Date of Birth: Sep 25, 1946 Referring Provider (PT): Marcelino Scot   Encounter Date: 04/05/2020  PT End of Session - 04/05/20 1504    Visit Number  10    Date for PT Re-Evaluation  04/24/20    PT Start Time  1400    PT Stop Time  1455    PT Time Calculation (min)  55 min       No past medical history on file.  No past surgical history on file.  There were no vitals filed for this visit.  Subjective Assessment - 04/05/20 1400    Subjective  shoulder feels better, say she can raise her shoulder by herself now but does not use it at home at lot    Pertinent History  patient denies    Patient Stated Goals  have normal ROM, strength less difficulty with ADL's    Currently in Pain?  No/denies    Pain Score  0-No pain    Pain Onset  1 to 4 weeks ago                       [facility] Adult PT Treatment/Exercise - 04/05/20 0001      Neck Exercises: Machines for Strengthening   UBE (Upper Arm Bike)  L2 3 fwd/ 3 back      Neck Exercises: Standing   Wall Wash  w/ pillow case up and dwon;side to side;    10 reps each     Shoulder Exercises: Standing   External Rotation  Strengthening;10 reps;Theraband    Theraband Level (Shoulder External Rotation)  Level 1 (Yellow)    Internal Rotation  Strengthening;10 reps;Theraband    Theraband Level (Shoulder Internal Rotation)  Level 1 (Yellow)    Other Standing Exercises  1 level cabinet func reaching      Shoulder Exercises: ROM/Strengthening   Other ROM/Strengthening Exercises  tricep ex 15# 2 sets 15    Other ROM/Strengthening Exercises  row 15# pulleys 2 sets 15      Manual Therapy   Manual Therapy  Passive ROM    Passive ROM  all directions               PT Short Term Goals - 03/11/20  1046      PT SHORT TERM GOAL #1   Title  independent with initial HEP    Status  Achieved        PT Long Term Goals - 04/05/20 1508      PT LONG TERM GOAL #1   Title  understand postrure and body mechanics    Time  8    Period  Weeks    Status  Partially Met      PT LONG TERM GOAL #2   Title  decrease pain 50%    Time  8    Period  Weeks    Status  Partially Met      PT LONG TERM GOAL #3   Title  report able to dress and do hair without difficulty    Time  8    Period  Weeks    Status  Partially Met      PT LONG TERM GOAL #4   Title  increase flexion to 120 degrees    Time  8    Period  Weeks    Status  Partially Met            Plan - 04/05/20 1507    Clinical Impression Statement  AROM and strength is increasing, tactile and verb cues for technique, tactile cues required for posture    Stability/Clinical Decision Making  Evolving/Moderate complexity    Rehab Potential  Good    PT Frequency  2x / week    PT Treatment/Interventions  ADLs/Self Care Home Management;Cryotherapy;Electrical Stimulation;Moist Heat;Therapeutic activities;Therapeutic exercise;Manual techniques;Patient/family education    PT Next Visit Plan  MD pleased ROM and strength slowly inceasing       Patient will benefit from skilled therapeutic intervention in order to improve the following deficits and impairments:  Pain, Improper body mechanics, Increased muscle spasms, Postural dysfunction, Impaired UE functional use, Decreased strength, Decreased range of motion, Decreased activity tolerance  Visit Diagnosis: Acute pain of right shoulder  Stiffness of right shoulder, not elsewhere classified  Cramp and spasm     Problem List There are no problems to display for this patient.   Clarene Essex 04/05/2020, 3:11 PM  [place] [address] [place], [place], [address] Phone: [phone]   Fax:  [phone]  Name:  Shelia Fuentes MRN: [id number] Date of Birth: October 28, 1946

## 2020-04-07 ENCOUNTER — Ambulatory Visit: Payer: Medicare Other | Admitting: Physical Therapy

## 2020-04-07 ENCOUNTER — Other Ambulatory Visit: Payer: Self-pay

## 2020-04-07 DIAGNOSIS — M25611 Stiffness of right shoulder, not elsewhere classified: Secondary | ICD-10-CM

## 2020-04-07 DIAGNOSIS — R252 Cramp and spasm: Secondary | ICD-10-CM

## 2020-04-07 DIAGNOSIS — M25511 Pain in right shoulder: Secondary | ICD-10-CM

## 2020-04-07 NOTE — Therapy (Signed)
[address] [address] [place], [place], [address] Phone: [phone]   Fax:  [phone]  Physical Therapy Treatment  Patient Details  Name: Shelia Fuentes MRN: [id number] Date of Birth: 1946-10-04 Referring Provider (PT): Marcelino Scot   Encounter Date: 04/07/2020  PT End of Session - 04/07/20 1558    Visit Number  11    Date for PT Re-Evaluation  04/24/20    PT Start Time  1448    PT Stop Time  1539    PT Time Calculation (min)  51 min    Activity Tolerance  Patient limited by pain    Behavior During Therapy  [facility] for tasks assessed/performed       No past medical history on file.  No past surgical history on file.  There were no vitals filed for this visit.  Subjective Assessment - 04/07/20 1450    Subjective  says her shld has been improving, she can now put her arm in 2 o'clock position to drive    Currently in Pain?  No/denies    Pain Score  0-No pain                       [facility] Adult PT Treatment/Exercise - 04/07/20 0001      Neck Exercises: Machines for Strengthening   UBE (Upper Arm Bike)  L2 3 fwd/ 3 back      Shoulder Exercises: Standing   External Rotation  Strengthening;20 reps;Right    Theraband Level (Shoulder External Rotation)  Level 1 (Yellow)    Internal Rotation  Strengthening;Right;20 reps    Theraband Level (Shoulder Internal Rotation)  Level 1 (Yellow)      Shoulder Exercises: ROM/Strengthening   Lat Pull  1.5 plate   2 sets 15 reps   Cybex Press  1 plate   2 sets 10 reps   Cybex Row  1.5 plate   2 sets 15 reps   Other ROM/Strengthening Exercises  bicep curl 4#, 2 set 15reps    Other ROM/Strengthening Exercises  tricep ext w/ yellow theraband, 2 sets 15 reps      Manual Therapy   Manual Therapy  Passive ROM    Manual therapy comments  was able to touch her hair with  min assist    Passive ROM  all directions               PT Short Term Goals - 04/07/20  1659      PT SHORT TERM GOAL #1   Title  independent with initial HEP    Time  2    Period  Weeks    Status  Achieved        PT Long Term Goals - 04/07/20 1700      PT LONG TERM GOAL #1   Title  understand postrure and body mechanics    Time  8    Period  Weeks    Status  Partially Met      PT LONG TERM GOAL #2   Title  decrease pain 50%    Time  8    Period  Weeks    Status  Partially Met      PT LONG TERM GOAL #3   Title  report able to dress and do hair without difficulty    Time  8    Period  Weeks    Status  Partially Met      PT  LONG TERM GOAL #4   Title  increase flexion to 120 degrees    Time  8    Period  Weeks    Status  Partially Met            Plan - 04/07/20 1659    Stability/Clinical Decision Making  --    Rehab Potential  --    PT Frequency  --    PT Duration  --    PT Treatment/Interventions  --    PT Next Visit Plan  --       Patient will benefit from skilled therapeutic intervention in order to improve the following deficits and impairments:  Pain, Improper body mechanics, Increased muscle spasms, Postural dysfunction, Impaired UE functional use, Decreased strength, Decreased range of motion, Decreased activity tolerance  Visit Diagnosis: Acute pain of right shoulder  Stiffness of right shoulder, not elsewhere classified  Cramp and spasm     Problem List There are no problems to display for this patient.  Braylin Clark SPTA Salote Weidmann,ANGIE PTA 04/07/2020, 5:11 PM During this treatment session, the therapist was present, participating in and directing the treatment.  [place], [place], [address] Phone: [phone]   Fax:  [phone]  Name: Shelia Fuentes MRN: [id number] Date of Birth: [birth year]

## 2020-04-07 NOTE — Therapy (Cosign Needed)
[place] [address] [place], [place], [address] Phone: [phone]   Fax:  [phone]  Physical Therapy Treatment  Patient Details  Name: Shelia Fuentes MRN: [id number] Date of Birth: 08/17/1946 Referring Provider (PT): Marcelino Scot   Encounter Date: 04/07/2020  PT End of Session - 04/07/20 1558    Visit Number  11    Date for PT Re-Evaluation  04/24/20    PT Start Time  1448    PT Stop Time  1539    PT Time Calculation (min)  51 min    Activity Tolerance  Patient limited by pain    Behavior During Therapy  [facility] for tasks assessed/performed       No past medical history on file.  No past surgical history on file.  There were no vitals filed for this visit.  Subjective Assessment - 04/07/20 1450    Subjective  says her shld has been improving, she can now put her arm in 2 o'clock position to drive    Currently in Pain?  No/denies    Pain Score  0-No pain                       [facility] Adult PT Treatment/Exercise - 04/07/20 0001      Neck Exercises: Machines for Strengthening   UBE (Upper Arm Bike)  L2 3 fwd/ 3 back      Shoulder Exercises: Standing   External Rotation  Strengthening;20 reps;Right    Theraband Level (Shoulder External Rotation)  Level 1 (Yellow)    Internal Rotation  Strengthening;Right;20 reps    Theraband Level (Shoulder Internal Rotation)  Level 1 (Yellow)      Shoulder Exercises: ROM/Strengthening   Lat Pull  1.5 plate   2 sets 15 reps   Cybex Press  1 plate   2 sets 10 reps   Cybex Row  1.5 plate   2 sets 15 reps   Other ROM/Strengthening Exercises  bicep curl 4#, 2 set 15reps    Other ROM/Strengthening Exercises  tricep ext w/ yellow theraband, 2 sets 15 reps      Manual Therapy   Manual Therapy  Passive ROM    Manual therapy comments  was able to touch her hair with  min assist    Passive ROM  all directions               PT Short Term Goals - 04/07/20  1659      PT SHORT TERM GOAL #1   Title  independent with initial HEP    Time  2    Period  Weeks    Status  Achieved        PT Long Term Goals - 04/07/20 1700      PT LONG TERM GOAL #1   Title  understand postrure and body mechanics    Time  8    Period  Weeks    Status  Partially Met      PT LONG TERM GOAL #2   Title  decrease pain 50%    Time  8    Period  Weeks    Status  Partially Met      PT LONG TERM GOAL #3   Title  report able to dress and do hair without difficulty    Time  8    Period  Weeks    Status  Partially Met      PT  LONG TERM GOAL #4   Title  increase flexion to 120 degrees    Time  8    Period  Weeks    Status  Partially Met            Plan - 04/07/20 1659    Stability/Clinical Decision Making  Evolving/Moderate complexity    Rehab Potential  Good    PT Frequency  2x / week    PT Duration  8 weeks    PT Treatment/Interventions  ADLs/Self Care Home Management;Cryotherapy;Electrical Stimulation;Moist Heat;Therapeutic activities;Therapeutic exercise;Manual techniques;Patient/family education       Patient will benefit from skilled therapeutic intervention in order to improve the following deficits and impairments:  Pain, Improper body mechanics, Increased muscle spasms, Postural dysfunction, Impaired UE functional use, Decreased strength, Decreased range of motion, Decreased activity tolerance  Visit Diagnosis: Acute pain of right shoulder  Stiffness of right shoulder, not elsewhere classified  Cramp and spasm     Problem List There are no problems to display for this patient.   Clarene Essex, SPTA 04/07/2020, 5:04 PM  [place] [address] [place], [place], [address] Phone: [phone]   Fax:  [phone]  Name: Kyrsten Deleeuw MRN: [id number] Date of Birth: 11-17-1946

## 2020-04-12 ENCOUNTER — Ambulatory Visit: Payer: Medicare Other | Admitting: Physical Therapy

## 2020-04-12 ENCOUNTER — Other Ambulatory Visit: Payer: Self-pay

## 2020-04-12 DIAGNOSIS — M25511 Pain in right shoulder: Secondary | ICD-10-CM

## 2020-04-12 DIAGNOSIS — M25611 Stiffness of right shoulder, not elsewhere classified: Secondary | ICD-10-CM | POA: Diagnosis not present

## 2020-04-12 DIAGNOSIS — R252 Cramp and spasm: Secondary | ICD-10-CM

## 2020-04-12 NOTE — Therapy (Signed)
[facility] [address] [place], [place], [address] Phone: [phone]   Fax:  [phone]  Physical Therapy Treatment  Patient Details  Name: Shelia Fuentes MRN: [id number] Date of Birth: 05/08/1946 Referring Provider (PT): Handy   Encounter Date: 04/12/2020  PT End of Session - 04/12/20 1243    Visit Number  12    Date for PT Re-Evaluation  04/24/20    PT Start Time  1101    PT Stop Time  1158    PT Time Calculation (min)  57 min    Activity Tolerance  Patient tolerated treatment well    Behavior During Therapy  [facility] for tasks assessed/performed       No past medical history on file.  No past surgical history on file.  There were no vitals filed for this visit.  Subjective Assessment - 04/12/20 1102    Subjective  She states that her arm is still doing better but a little stiff today, says that she "could reach her arm up all the way yesterday"    Patient Stated Goals  have normal ROM, strength less difficulty with ADL's    Currently in Pain?  No/denies    Pain Score  0-No pain                       [facility] Adult PT Treatment/Exercise - 04/12/20 0001      Neck Exercises: Machines for Strengthening   UBE (Upper Arm Bike)  L2 3 fwd/ 3 back      Neck Exercises: Standing   Wall Wash  w/ pillow case up and dwon;side to side;       Shoulder Exercises: Standing   External Rotation  AAROM;10 reps   attempted w/ theraband; was difficlu for patient   Internal Rotation  Strengthening;Right;20 reps    Theraband Level (Shoulder Internal Rotation)  Level 1 (Yellow)      Shoulder Exercises: Pulleys   Other Pulley Exercises  rows w/ bar, 20#, 3 x10      Shoulder Exercises: ROM/Strengthening   Lat Pull  1.5 plate;20 reps    Cybex Press  1 plate;20 reps    Other ROM/Strengthening Exercises  bicep curl 10#, 2x15    Other ROM/Strengthening Exercises  tricep ext, 15# 2x15      Manual Therapy   Manual  Therapy  Passive ROM    Manual therapy comments  still limited with abduction    Passive ROM  all directions               PT Short Term Goals - 04/07/20 1659      PT SHORT TERM GOAL #1   Title  independent with initial HEP    Time  2    Period  Weeks    Status  Achieved        PT Long Term Goals - 04/07/20 1700      PT LONG TERM GOAL #1   Title  understand postrure and body mechanics    Time  8    Period  Weeks    Status  Partially Met      PT LONG TERM GOAL #2   Title  decrease pain 50%    Time  8    Period  Weeks    Status  Partially Met      PT LONG TERM GOAL #3   Title  report able to dress and do hair   without difficulty    Time  8    Period  Weeks    Status  Partially Met      PT LONG TERM GOAL #4   Title  increase flexion to 120 degrees    Time  8    Period  Weeks    Status  Partially Met            Plan - 04/12/20 1245    Clinical Impression Statement  Pt tolerated treatment well, she was able to touch the back of her head with her R arm, cues needed to relax during PROM, cues to not lean body back during bicep curls, cues also needed for posture and technique durinng rows    Stability/Clinical Decision Making  Evolving/Moderate complexity    PT Treatment/Interventions  ADLs/Self Care Home Management;Cryotherapy;Electrical Stimulation;Moist Heat;Therapeutic activities;Therapeutic exercise;Manual techniques;Patient/family education    PT Next Visit Plan  progress ROM and func strength       Patient will benefit from skilled therapeutic intervention in order to improve the following deficits and impairments:  Pain, Improper body mechanics, Increased muscle spasms, Postural dysfunction, Impaired UE functional use, Decreased strength, Decreased range of motion, Decreased activity tolerance  Visit Diagnosis: Acute pain of right shoulder  Stiffness of right shoulder, not elsewhere classified  Cramp and spasm     Problem List There are  no problems to display for this patient.   Braylin Clark, SPTA 04/12/2020, 1:29 PM  [facility] [address] [place], [place], [address] Phone: [phone]   Fax:  [phone]  Name: Shelia Fuentes MRN: [id number] Date of Birth: 03/11/1946   

## 2020-04-14 ENCOUNTER — Other Ambulatory Visit: Payer: Self-pay

## 2020-04-14 ENCOUNTER — Ambulatory Visit: Payer: Medicare Other | Admitting: Physical Therapy

## 2020-04-14 DIAGNOSIS — R252 Cramp and spasm: Secondary | ICD-10-CM

## 2020-04-14 DIAGNOSIS — M25511 Pain in right shoulder: Secondary | ICD-10-CM

## 2020-04-14 DIAGNOSIS — M25611 Stiffness of right shoulder, not elsewhere classified: Secondary | ICD-10-CM | POA: Diagnosis not present

## 2020-04-14 NOTE — Therapy (Signed)
[place] [place], [place], [address] Phone: [phone]   Fax:  [phone]  Physical Therapy Treatment  Patient Details  Name: Shelia Fuentes MRN: [id number] Date of Birth: May 17, 1946 Referring Provider (PT): Marcelino Scot   Encounter Date: 04/14/2020  PT End of Session - 04/14/20 1245    Visit Number  13    Date for PT Re-Evaluation  04/24/20    PT Start Time  1139    PT Stop Time  1238    PT Time Calculation (min)  59 min    Activity Tolerance  Patient tolerated treatment well    Behavior During Therapy  [facility] for tasks assessed/performed       No past medical history on file.  No past surgical history on file.  There were no vitals filed for this visit.  Subjective Assessment - 04/14/20 1144    Subjective  "my shoulder is just stiff"    Patient Stated Goals  have normal ROM, strength less difficulty with ADL's    Currently in Pain?  Yes    Pain Score  1     Pain Location  Shoulder    Pain Orientation  Right                       [facility] Adult PT Treatment/Exercise - 04/14/20 0001      Neck Exercises: Machines for Strengthening   UBE (Upper Arm Bike)  L2 3 min fwd/ 3 min back      Shoulder Exercises: Supine   Protraction  Strengthening;Right;Weights   2#, 30 sec reaching in many directions, 2x   Flexion  Strengthening;Right;20 reps;Weights   2#   ABduction  --      Shoulder Exercises: Seated   Other Seated Exercises  bicep curls, 2x10, 2#, 5# sec set      Shoulder Exercises: Sidelying   External Rotation  Strengthening;20 reps;Right;Weights   2#, assist to fill ROM; c/o bicep tendon pain   ABduction  Strengthening;Right;20 reps;Weights   2#     Shoulder Exercises: Standing   Other Standing Exercises  ER and IR with Nustep handle bar, 2x10    Other Standing Exercises  front and side cab reaches, 2nd level   1st level for side reahces; reaching across      Manual Therapy    Manual Therapy  Passive ROM    Joint Mobilization  slight joint distract    Passive ROM  all directions               PT Short Term Goals - 04/07/20 1659      PT SHORT TERM GOAL #1   Title  independent with initial HEP    Time  2    Period  Weeks    Status  Achieved        PT Long Term Goals - 04/14/20 1250      PT LONG TERM GOAL #1   Title  understand postrure and body mechanics    Time  8    Period  Weeks    Status  Partially Met      PT LONG TERM GOAL #2   Title  decrease pain 50%    Time  8    Period  Weeks    Status  Partially Met      PT LONG TERM GOAL #3   Title  report able to dress and do hair without difficulty  Time  8    Period  Weeks    Status  Partially Met      PT LONG TERM GOAL #4   Title  increase flexion to 120 degrees    Time  8    Period  Weeks    Status  Partially Met            Plan - 04/14/20 1246    Clinical Impression Statement  Pt is showing more strength to lift the R UE with an increased weight, she c/o pain bicep tendon w/ ER, she was able to reach behind her head and touch her left ear w/ the R shld, cues needed to fill ROM during exercises    Rehab Potential  Good    PT Treatment/Interventions  ADLs/Self Care Home Management;Cryotherapy;Electrical Stimulation;Moist Heat;Therapeutic activities;Therapeutic exercise;Manual techniques;Patient/family education       Patient will benefit from skilled therapeutic intervention in order to improve the following deficits and impairments:  Pain, Improper body mechanics, Increased muscle spasms, Postural dysfunction, Impaired UE functional use, Decreased strength, Decreased range of motion, Decreased activity tolerance  Visit Diagnosis: Acute pain of right shoulder  Stiffness of right shoulder, not elsewhere classified  Cramp and spasm     Problem List There are no problems to display for this patient.   Clarene Essex, SPTA 04/14/2020, 12:52 PM  [place] [address] [place], [place], [address] Phone: [phone]   Fax:  [phone]  Name: Shelia Fuentes MRN: [id number] Date of Birth: 1946-05-04

## 2020-04-19 ENCOUNTER — Encounter: Payer: Self-pay | Admitting: Physical Therapy

## 2020-04-19 ENCOUNTER — Ambulatory Visit: Payer: Medicare Other | Admitting: Physical Therapy

## 2020-04-19 ENCOUNTER — Other Ambulatory Visit: Payer: Self-pay

## 2020-04-19 DIAGNOSIS — M25611 Stiffness of right shoulder, not elsewhere classified: Secondary | ICD-10-CM

## 2020-04-19 DIAGNOSIS — M25511 Pain in right shoulder: Secondary | ICD-10-CM

## 2020-04-19 DIAGNOSIS — R252 Cramp and spasm: Secondary | ICD-10-CM

## 2020-04-19 NOTE — Therapy (Signed)
[facility] [address] [place], [place], [address] Phone: [phone]   Fax:  [phone]  Physical Therapy Treatment  Patient Details  Name: Shelia Fuentes MRN: [id number] Date of Birth: 02/22/1946 Referring Provider (PT): Handy   Encounter Date: 04/19/2020  PT End of Session - 04/19/20 1341    Visit Number  14    Date for PT Re-Evaluation  04/24/20    PT Start Time  1300    PT Stop Time  1342    PT Time Calculation (min)  42 min    Activity Tolerance  Patient tolerated treatment well    Behavior During Therapy  WFL for tasks assessed/performed       History reviewed. No pertinent past medical history.  History reviewed. No pertinent surgical history.  There were no vitals filed for this visit.  Subjective Assessment - 04/19/20 1301    Subjective  "Its their" Pt reports some pain in her R shoulder today.    Pertinent History  patient denies    Patient Stated Goals  have normal ROM, strength less difficulty with ADL's    Currently in Pain?  Yes    Pain Score  1     Pain Location  Shoulder    Pain Orientation  Right         [facility] PT Assessment - 04/19/20 0001      AROM   AROM Assessment Site  Shoulder    Right/Left Shoulder  Right    Right Shoulder Flexion  112 Degrees    Right Shoulder ABduction  60 Degrees      PROM   PROM Assessment Site  Shoulder    Right/Left Shoulder  Right                   [facility] Adult PT Treatment/Exercise - 04/19/20 0001      Neck Exercises: Machines for Strengthening   UBE (Upper Arm Bike)  L 3 min fwd/ 3 min back      Shoulder Exercises: Supine   External Rotation  Strengthening;Right;10 reps;Weights    External Rotation Weight (lbs)  2    Internal Rotation  Right;Strengthening;10 reps;Weights    Internal Rotation Weight (lbs)  2      Shoulder Exercises: Standing   Flexion  AAROM;Right;10 reps   ladder   ABduction  AAROM;Right;10 reps   ladder   Extension  Theraband;20 reps;Both;AAROM;Strengthening    Theraband Level (Shoulder Extension)  Level 1 (Yellow)    Row  Theraband;20 reps;Both;Strengthening    Theraband Level (Shoulder Row)  Level 1 (Yellow)    Other Standing Exercises  2 level cabinet reaches counter top to 2nd level frlx and abd 2x10     Other Standing Exercises  Triceps ext 15lb 2x12       Manual Therapy   Manual Therapy  Passive ROM    Joint Mobilization  slight joint distract    Passive ROM  all directions               PT Short Term Goals - 04/07/20 1659      PT SHORT TERM GOAL #1   Title  independent with initial HEP    Time  2    Period  Weeks    Status  Achieved        PT Long Term Goals - 04/14/20 1250      PT LONG TERM GOAL #1   Title  understand postrure and   body mechanics    Time  8    Period  Weeks    Status  Partially Met      PT LONG TERM GOAL #2   Title  decrease pain 50%    Time  8    Period  Weeks    Status  Partially Met      PT LONG TERM GOAL #3   Title  report able to dress and do hair without difficulty    Time  8    Period  Weeks    Status  Partially Met      PT LONG TERM GOAL #4   Title  increase flexion to 120 degrees    Time  8    Period  Weeks    Status  Partially Met            Plan - 04/19/20 1343    Clinical Impression Statement  Pt with some progression in R shoulder AROM. PROM is a lot better compared to active. She was able to reach to the second level in the cabinet with R shoulder flexion and abduction with little compensation. Weakness exposed with standing ER/IR under light load.  PROM is well with some end range tightness. Visible R arm shaking with ER/IR    Stability/Clinical Decision Making  Evolving/Moderate complexity    Rehab Potential  Good    PT Frequency  2x / week    PT Duration  8 weeks    PT Treatment/Interventions  ADLs/Self Care Home Management;Cryotherapy;Electrical Stimulation;Moist Heat;Therapeutic activities;Therapeutic  exercise;Manual techniques;Patient/family education    PT Next Visit Plan  progress ROM and func strength       Patient will benefit from skilled therapeutic intervention in order to improve the following deficits and impairments:  Pain, Improper body mechanics, Increased muscle spasms, Postural dysfunction, Impaired UE functional use, Decreased strength, Decreased range of motion, Decreased activity tolerance  Visit Diagnosis: Acute pain of right shoulder  Stiffness of right shoulder, not elsewhere classified  Cramp and spasm     Problem List There are no problems to display for this patient.   Ronald G Pemberton, PTA 04/19/2020, 1:49 PM  [facility] [address] [place], [place], [address] Phone: [phone]   Fax:  [phone]  Name: Zaynab Kistner MRN: [id number] Date of Birth: 10/31/1946   

## 2020-04-21 ENCOUNTER — Other Ambulatory Visit: Payer: Self-pay

## 2020-04-21 ENCOUNTER — Ambulatory Visit: Payer: Medicare Other | Admitting: Physical Therapy

## 2020-04-21 DIAGNOSIS — M25611 Stiffness of right shoulder, not elsewhere classified: Secondary | ICD-10-CM

## 2020-04-21 DIAGNOSIS — M25511 Pain in right shoulder: Secondary | ICD-10-CM

## 2020-04-21 DIAGNOSIS — R252 Cramp and spasm: Secondary | ICD-10-CM

## 2020-04-21 NOTE — Therapy (Signed)
[place] [address] [place], [place], [address] Phone: [phone]   Fax:  [phone]  Physical Therapy Treatment  Patient Details  Name: Shelia Fuentes MRN: [id number] Date of Birth: 02/14/1946 Referring Provider (PT): Marcelino Scot   Encounter Date: 04/21/2020  PT End of Session - 04/21/20 1215    Visit Number  15    Date for PT Re-Evaluation  04/24/20    PT Start Time  1115    PT Stop Time  1203    PT Time Calculation (min)  48 min    Activity Tolerance  Patient tolerated treatment well    Behavior During Therapy  [facility] for tasks assessed/performed       No past medical history on file.  No past surgical history on file.  There were no vitals filed for this visit.  Subjective Assessment - 04/21/20 1113    Subjective  Shoulder feels fine, just hurts whe she moves it over head    Patient Stated Goals  have normal ROM, strength less difficulty with ADL's    Currently in Pain?  No/denies    Pain Score  0-No pain                       [facility] Adult PT Treatment/Exercise - 04/21/20 0001      Neck Exercises: Machines for Strengthening   UBE (Upper Arm Bike)  L 3 min fwd/ 3 min back      Shoulder Exercises: Supine   External Rotation  Strengthening;Right;10 reps;Weights    External Rotation Weight (lbs)  2    Flexion  Strengthening;Right;Weights;10 reps    Shoulder Flexion Weight (lbs)  2    Other Supine Exercises  punches w/ 2#, 30 sec , 2x      Shoulder Exercises: Seated   Flexion  Strengthening;Right;20 reps;Left   1#, 2nd set RUE only   Abduction  --    Other Seated Exercises  reaches 1#, 30 sec, 2x      Shoulder Exercises: Standing   Flexion  AROM;Both;10 reps    ABduction  AROM;Both;10 reps    Other Standing Exercises  shld abd, 1# 15x   assist needed to lift weight      Shoulder Exercises: ROM/Strengthening   Cybex Press  1 plate;20 reps      Manual Therapy   Manual Therapy  Passive  ROM    Passive ROM  all directions               PT Short Term Goals - 04/07/20 1659      PT SHORT TERM GOAL #1   Title  independent with initial HEP    Time  2    Period  Weeks    Status  Achieved        PT Long Term Goals - 04/21/20 1202      PT LONG TERM GOAL #1   Title  understand postrure and body mechanics    Status  Partially Met      PT LONG TERM GOAL #2   Title  decrease pain 50%    Baseline  95%    Status  Achieved      PT LONG TERM GOAL #3   Title  report able to dress and do hair without difficulty    Status  Partially Met      PT LONG TERM GOAL #4   Title  increase flexion to 120 degrees  Status  Partially Met            Plan - 04/21/20 1216    Clinical Impression Statement  Pt is able to flex shld over head with min pain but showed much weakness with R shld abd, palpable tenderness in her biceps tendon during exercise, she is still very limited with ER, she performed the reaches w/ weights well with some shaking, she plans to meet with her doctor to see if she should continue PT.    PT Treatment/Interventions  ADLs/Self Care Home Management;Cryotherapy;Electrical Stimulation;Moist Heat;Therapeutic activities;Therapeutic exercise;Manual techniques;Patient/family education    PT Next Visit Plan  Pt plans on meeting with her doctor to see if she needs to cont therapy but we suggest that she cont PT due to weakness w/ shld abd.       Patient will benefit from skilled therapeutic intervention in order to improve the following deficits and impairments:  Pain, Improper body mechanics, Increased muscle spasms, Postural dysfunction, Impaired UE functional use, Decreased strength, Decreased range of motion, Decreased activity tolerance  Visit Diagnosis: Acute pain of right shoulder  Stiffness of right shoulder, not elsewhere classified  Cramp and spasm     Problem List There are no problems to display for this patient.   Clarene Essex,  SPTA 04/21/2020, 12:33 PM  [address] [address] [place], [place], [address] Phone: [phone]   Fax:  [phone]  Name: Shelia Fuentes MRN: [id number] Date of Birth: 09/05/1946

## 2020-05-16 ENCOUNTER — Encounter: Payer: Self-pay | Admitting: Physical Therapy

## 2020-05-16 ENCOUNTER — Ambulatory Visit: Payer: Medicare Other | Attending: Orthopedic Surgery | Admitting: Physical Therapy

## 2020-05-16 ENCOUNTER — Other Ambulatory Visit: Payer: Self-pay

## 2020-05-16 DIAGNOSIS — M25611 Stiffness of right shoulder, not elsewhere classified: Secondary | ICD-10-CM | POA: Diagnosis present

## 2020-05-16 DIAGNOSIS — M25511 Pain in right shoulder: Secondary | ICD-10-CM | POA: Diagnosis not present

## 2020-05-16 DIAGNOSIS — R252 Cramp and spasm: Secondary | ICD-10-CM | POA: Insufficient documentation

## 2020-05-16 NOTE — Therapy (Signed)
[address] [address] [place], [place], [address] Phone: [phone]   Fax:  [phone]  Physical Therapy Treatment  Patient Details  Name: Shelia Fuentes MRN: [id number] Date of Birth: 01/08/1946 Referring Provider (PT): Marcelino Scot   Encounter Date: 05/16/2020  PT End of Session - 05/16/20 0623    Visit Number  16    Date for PT Re-Evaluation  07/16/20    PT Start Time  7628    PT Stop Time  1530    PT Time Calculation (min)  45 min    Activity Tolerance  Patient tolerated treatment well    Behavior During Therapy  [facility] for tasks assessed/performed       History reviewed. No pertinent past medical history.  History reviewed. No pertinent surgical history.  There were no vitals filed for this visit.  Subjective Assessment - 05/16/20 1445    Subjective  Pt reports that her shoulder has gotten much better; still having trouble reaching behind the back    Currently in Pain?  No/denies    Pain Score  0-No pain    Pain Location  Shoulder    Pain Orientation  Right         [facility] PT Assessment - 05/16/20 0001      AROM   Overall AROM Comments  functional R IR very limited    AROM Assessment Site  Shoulder    Right/Left Shoulder  Right    Right Shoulder Flexion  130 Degrees    Right Shoulder ABduction  85 Degrees                    [facility] Adult PT Treatment/Exercise - 05/16/20 0001      Neck Exercises: Machines for Strengthening   UBE (Upper Arm Bike)  L 3 min fwd/ 3 min back    Cybex Row  20# 2x15    Lat Pull  20# 2x15    Other Machines for Strengthening  tricep extension 5#, shoulder ext 10# 2x15      Shoulder Exercises: Standing   External Rotation  10 reps;Right;Strengthening    Theraband Level (Shoulder External Rotation)  Level 1 (Yellow)    Internal Rotation  Strengthening;Right;10 reps    Theraband Level (Shoulder Internal Rotation)  Level 1 (Yellow)      Shoulder Exercises:  ROM/Strengthening   Ball on Wall  x5, 5 sec hold    Other ROM/Strengthening Exercises  ball raise overhead x10      Manual Therapy   Manual Therapy  Passive ROM    Joint Mobilization  slight joint distract    Passive ROM  all directions               PT Short Term Goals - 04/07/20 1659      PT SHORT TERM GOAL #1   Title  independent with initial HEP    Time  2    Period  Weeks    Status  Achieved        PT Long Term Goals - 04/21/20 1202      PT LONG TERM GOAL #1   Title  understand postrure and body mechanics    Status  Partially Met      PT LONG TERM GOAL #2   Title  decrease pain 50%    Baseline  95%    Status  Achieved      PT LONG TERM GOAL #3   Title  report able to dress and do hair without difficulty    Status  Partially Met      PT LONG TERM GOAL #4   Title  increase flexion to 120 degrees    Status  Partially Met            Plan - 05/16/20 1531    Clinical Impression Statement  Pt demonstrates shoulder flexion WFL and shoulder abduction limited at 90 deg actively; however, pt is functionally weak with overhead activities. Pt able to tolerate progression of shoulder ex's with no increase in R shoulder pain. Pt demonstrates close to equivalent functional ER but very limited IR. Prescribed home stretch for IR with towel to address. Pt shows improvements in passive ROM but still tight in ER/IR. Continue to progress next rx.    PT Treatment/Interventions  ADLs/Self Care Home Management;Cryotherapy;Electrical Stimulation;Moist Heat;Therapeutic activities;Therapeutic exercise;Manual techniques;Patient/family education    PT Next Visit Plan  Progress UE strengthening/flexibility    Consulted and Agree with Plan of Care  Patient       Patient will benefit from skilled therapeutic intervention in order to improve the following deficits and impairments:  Pain, Improper body mechanics, Increased muscle spasms, Postural dysfunction, Impaired UE functional  use, Decreased strength, Decreased range of motion, Decreased activity tolerance  Visit Diagnosis: Acute pain of right shoulder  Stiffness of right shoulder, not elsewhere classified  Cramp and spasm     Problem List There are no problems to display for this patient.  Amador Cunas, PT, DPT Donald Prose Davey Bergsma 05/16/2020, 3:37 PM  [address] [place], [place], [address] Phone: [phone]   Fax:  [phone]  Name: Shelia Fuentes MRN: [id number] Date of Birth: Oct 09, 1946

## 2020-05-25 ENCOUNTER — Encounter: Payer: Self-pay | Admitting: Physical Therapy

## 2020-05-25 ENCOUNTER — Other Ambulatory Visit: Payer: Self-pay

## 2020-05-25 ENCOUNTER — Ambulatory Visit: Payer: Medicare Other | Attending: Orthopedic Surgery | Admitting: Physical Therapy

## 2020-05-25 DIAGNOSIS — M25511 Pain in right shoulder: Secondary | ICD-10-CM | POA: Diagnosis present

## 2020-05-25 DIAGNOSIS — M25611 Stiffness of right shoulder, not elsewhere classified: Secondary | ICD-10-CM | POA: Insufficient documentation

## 2020-05-25 DIAGNOSIS — R252 Cramp and spasm: Secondary | ICD-10-CM

## 2020-05-25 NOTE — Therapy (Signed)
[place] [address] [place], [place], [address] Phone: [phone]   Fax:  [phone]  Physical Therapy Treatment  Patient Details  Name: Shelia Fuentes MRN: [id number] Date of Birth: [birth year] Referring Provider (PT): Marcelino Scot   Encounter Date: 05/25/2020  PT End of Session - 05/25/20 1529    Visit Number  17    Date for PT Re-Evaluation  07/16/20    PT Start Time  0315    PT Stop Time  1529    PT Time Calculation (min)  44 min    Activity Tolerance  Patient tolerated treatment well    Behavior During Therapy  [facility] for tasks assessed/performed       History reviewed. No pertinent past medical history.  History reviewed. No pertinent surgical history.  There were no vitals filed for this visit.  Subjective Assessment - 05/25/20 1443    Subjective  Pt reports that she is a little tight today in R shoulder but doing good overall    Currently in Pain?  Yes    Pain Score  1     Pain Location  Shoulder    Pain Orientation  Right                        [facility] Adult PT Treatment/Exercise - 05/25/20 0001      Neck Exercises: Machines for Strengthening   UBE (Upper Arm Bike)  L 3 min fwd/ 3 min back    Cybex Row  20# 1x15, 25# 1x15    Lat Pull  20# 2x15    Other Machines for Strengthening  shoulder ext 10# 2x10      Shoulder Exercises: Standing   External Rotation  20 reps;Right;Strengthening    Theraband Level (Shoulder External Rotation)  Level 1 (Yellow)    Internal Rotation  Strengthening;Right;20 reps    Theraband Level (Shoulder Internal Rotation)  Level 1 (Yellow)    Other Standing Exercises  wall slide with 1# weight 2x10 flexion/abduction      Shoulder Exercises: ROM/Strengthening   Other ROM/Strengthening Exercises  ball raise overhead x10    Other ROM/Strengthening Exercises  tricep ext 15# 2x10, bicep curl 10# 2x10      Manual Therapy   Manual Therapy  Passive ROM    Joint  Mobilization  slight joint distract    Passive ROM  all directions               PT Short Term Goals - 04/07/20 1659      PT SHORT TERM GOAL #1   Title  independent with initial HEP    Time  2    Period  Weeks    Status  Achieved        PT Long Term Goals - 04/21/20 1202      PT LONG TERM GOAL #1   Title  understand postrure and body mechanics    Status  Partially Met      PT LONG TERM GOAL #2   Title  decrease pain 50%    Baseline  95%    Status  Achieved      PT LONG TERM GOAL #3   Title  report able to dress and do hair without difficulty    Status  Partially Met      PT LONG TERM GOAL #4   Title  increase flexion to 120 degrees    Status  Partially Met  Plan - 05/25/20 1529    Clinical Impression Statement  Pt tolerated progression of TE well; still very limited in functional overhead strength/ROM and functional IR. Passive ROM to address impairments. Did well with machine interventions.    PT Treatment/Interventions  ADLs/Self Care Home Management;Cryotherapy;Electrical Stimulation;Moist Heat;Therapeutic activities;Therapeutic exercise;Manual techniques;Patient/family education    PT Next Visit Plan  Progress UE strengthening/flexibility    Consulted and Agree with Plan of Care  Patient       Patient will benefit from skilled therapeutic intervention in order to improve the following deficits and impairments:  Pain, Improper body mechanics, Increased muscle spasms, Postural dysfunction, Impaired UE functional use, Decreased strength, Decreased range of motion, Decreased activity tolerance  Visit Diagnosis: Stiffness of right shoulder, not elsewhere classified  Acute pain of right shoulder  Cramp and spasm     Problem List There are no problems to display for this patient.  Amador Cunas, PT, DPT Donald Prose Vickee Mormino 05/25/2020, 3:31 PM  [address]  [place], [place], [address] Phone: [phone]   Fax:  [phone]  Name: Shelia Fuentes MRN: [id number] Date of Birth: [birth year]

## 2020-06-01 ENCOUNTER — Ambulatory Visit: Payer: Medicare Other | Admitting: Physical Therapy

## 2020-06-01 ENCOUNTER — Other Ambulatory Visit: Payer: Self-pay

## 2020-06-01 ENCOUNTER — Encounter: Payer: Self-pay | Admitting: Physical Therapy

## 2020-06-01 DIAGNOSIS — M25611 Stiffness of right shoulder, not elsewhere classified: Secondary | ICD-10-CM | POA: Diagnosis not present

## 2020-06-01 DIAGNOSIS — M25511 Pain in right shoulder: Secondary | ICD-10-CM

## 2020-06-01 DIAGNOSIS — R252 Cramp and spasm: Secondary | ICD-10-CM

## 2020-06-01 NOTE — Therapy (Signed)
[facility] [place], [place], [address] Phone: [phone]   Fax:  [phone]  Physical Therapy Treatment  Patient Details  Name: Shelia Fuentes MRN: [id number] Date of Birth: [birth year] Referring Provider (PT): Marcelino Scot   Encounter Date: 06/01/2020  PT End of Session - 06/01/20 1525    Visit Number  18    Date for PT Re-Evaluation  07/16/20    PT Start Time  3335    PT Stop Time  1525    PT Time Calculation (min)  42 min    Activity Tolerance  Patient tolerated treatment well    Behavior During Therapy  [facility] for tasks assessed/performed       History reviewed. No pertinent past medical history.  History reviewed. No pertinent surgical history.  There were no vitals filed for this visit.  Subjective Assessment - 06/01/20 1453    Subjective  Pt reports that she is feeling good today; started working out at the gym again doing light exercises    Currently in Pain?  No/denies    Pain Score  0-No pain    Pain Location  Shoulder    Pain Orientation  Right                        [facility] Adult PT Treatment/Exercise - 06/01/20 0001      Neck Exercises: Machines for Strengthening   UBE (Upper Arm Bike)  L 3 min fwd/ 3 min back    Cybex Row  25# 2x15    Cybex Chest Press  10# 2x15    Lat Pull  25# 2x15    Other Machines for Strengthening  shoulder ext 10# 2x15    Other Machines for Strengthening  tricep extension 15# 2x15, bicep curls 10# 2x15      Shoulder Exercises: Standing   External Rotation  20 reps;Right;Strengthening    Theraband Level (Shoulder External Rotation)  Level 3 (Green)    Internal Rotation  Strengthening;Right;20 reps    Theraband Level (Shoulder Internal Rotation)  Level 3 (Green)    Flexion  AROM;Strengthening;Both;20 reps;Weights    Shoulder Flexion Weight (lbs)  1    ABduction  AROM;Strengthening;Both;20 reps    Shoulder ABduction Weight (lbs)  1    Other Standing  Exercises  shelf taps 1# flexion/abduction 1x10    Other Standing Exercises  wall slide with 3# weight 2x10 flexion/abduction               PT Short Term Goals - 04/07/20 1659      PT SHORT TERM GOAL #1   Title  independent with initial HEP    Time  2    Period  Weeks    Status  Achieved        PT Long Term Goals - 04/21/20 1202      PT LONG TERM GOAL #1   Title  understand postrure and body mechanics    Status  Partially Met      PT LONG TERM GOAL #2   Title  decrease pain 50%    Baseline  95%    Status  Achieved      PT LONG TERM GOAL #3   Title  report able to dress and do hair without difficulty    Status  Partially Met      PT LONG TERM GOAL #4   Title  increase flexion to 120 degrees  Status  Partially Met            Plan - 06/01/20 1525    Clinical Impression Statement  Pt tolerated progression of TE; cues for posture/form with standing abduction ex's to avoid compensation. Difficulty with functional IR/ER; educated on importance of continuing to stretch at home. Did well with machine interventions. Continue to progress ROM and strength.    PT Treatment/Interventions  ADLs/Self Care Home Management;Cryotherapy;Electrical Stimulation;Moist Heat;Therapeutic activities;Therapeutic exercise;Manual techniques;Patient/family education    PT Next Visit Plan  Progress UE strengthening/flexibility    Consulted and Agree with Plan of Care  Patient       Patient will benefit from skilled therapeutic intervention in order to improve the following deficits and impairments:  Pain, Improper body mechanics, Increased muscle spasms, Postural dysfunction, Impaired UE functional use, Decreased strength, Decreased range of motion, Decreased activity tolerance  Visit Diagnosis: Stiffness of right shoulder, not elsewhere classified  Acute pain of right shoulder  Cramp and spasm     Problem List There are no problems to display for this patient.  Amador Cunas, PT, DPT Donald Prose Lalitha Ilyas 06/01/2020, 3:27 PM  [address] [place], [place], [address] Phone: [phone]   Fax:  [phone]  Name: Shelia Fuentes MRN: [id number] Date of Birth: 1946/07/17

## 2020-06-08 ENCOUNTER — Ambulatory Visit: Payer: Medicare Other | Admitting: Physical Therapy

## 2020-06-08 ENCOUNTER — Encounter: Payer: Self-pay | Admitting: Physical Therapy

## 2020-06-08 ENCOUNTER — Other Ambulatory Visit: Payer: Self-pay

## 2020-06-08 DIAGNOSIS — M25611 Stiffness of right shoulder, not elsewhere classified: Secondary | ICD-10-CM | POA: Diagnosis not present

## 2020-06-08 DIAGNOSIS — R252 Cramp and spasm: Secondary | ICD-10-CM

## 2020-06-08 DIAGNOSIS — M25511 Pain in right shoulder: Secondary | ICD-10-CM

## 2020-06-08 NOTE — Therapy (Signed)
[name], [place], [address] Phone: [phone]   Fax:  [phone]  Physical Therapy Treatment  Patient Details  Name: Shelia Fuentes MRN: [id number] Date of Birth: [birth year] Referring Provider (PT): Marcelino Scot   Encounter Date: 06/08/2020   PT End of Session - 06/08/20 1696    Visit Number 19    Date for PT Re-Evaluation 07/16/20    PT Start Time 7893    PT Stop Time 1530    PT Time Calculation (min) 45 min    Activity Tolerance Patient tolerated treatment well    Behavior During Therapy [facility] for tasks assessed/performed           History reviewed. No pertinent past medical history.  History reviewed. No pertinent surgical history.  There were no vitals filed for this visit.   Subjective Assessment - 06/08/20 1445    Subjective Pt reports that she is feeling good    Currently in Pain? Yes    Pain Score 1     Pain Location Shoulder    Pain Orientation Right                             [facility] Adult PT Treatment/Exercise - 06/08/20 0001      Neck Exercises: Machines for Strengthening   Cybex Row 25# 2x15    Cybex Chest Press 10# 2x15    Lat Pull 25# 2x15    Other Machines for Strengthening shoulder ext 10# 2x10    Other Machines for Strengthening tricep extension 15# 2x15, bicep curls 10# 2x15      Shoulder Exercises: Standing   External Rotation 20 reps;Right;Strengthening    Theraband Level (Shoulder External Rotation) Level 3 (Green)    Internal Rotation Strengthening;Right;20 reps    Theraband Level (Shoulder Internal Rotation) Level 3 (Green)    Flexion AROM;Strengthening;Both;20 reps;Weights    Shoulder Flexion Weight (lbs) 1    ABduction AROM;Strengthening;Both;20 reps    Shoulder ABduction Weight (lbs) 1    Other Standing Exercises shelf taps 1# flexion/abduction 1x10      Shoulder Exercises: ROM/Strengthening   UBE (Upper Arm Bike) 3 min fwd/3 min  backward L 3      Manual Therapy   Manual Therapy Passive ROM    Joint Mobilization slight joint distract    Passive ROM all directions                    PT Short Term Goals - 04/07/20 1659      PT SHORT TERM GOAL #1   Title independent with initial HEP    Time 2    Period Weeks    Status Achieved             PT Long Term Goals - 04/21/20 1202      PT LONG TERM GOAL #1   Title understand postrure and body mechanics    Status Partially Met      PT LONG TERM GOAL #2   Title decrease pain 50%    Baseline 95%    Status Achieved      PT LONG TERM GOAL #3   Title report able to dress and do hair without difficulty    Status Partially Met      PT LONG TERM GOAL #4   Title increase flexion to 120 degrees    Status Partially Met  Plan - 06/08/20 1517    Clinical Impression Statement Pt required cuing to avoid compensation with standing IR/ER. Pt did well with machine interventions. Continues to have limitations/tightness with passive and active IR/ER/abduction. Pt has MD follow up 06/15/20.    PT Treatment/Interventions ADLs/Self Care Home Management;Cryotherapy;Electrical Stimulation;Moist Heat;Therapeutic activities;Therapeutic exercise;Manual techniques;Patient/family education    PT Next Visit Plan Progress UE strengthening/flexibility, ROM and measurements for MD visit next rx    Consulted and Agree with Plan of Care Patient           Patient will benefit from skilled therapeutic intervention in order to improve the following deficits and impairments:  Pain, Improper body mechanics, Increased muscle spasms, Postural dysfunction, Impaired UE functional use, Decreased strength, Decreased range of motion, Decreased activity tolerance  Visit Diagnosis: Stiffness of right shoulder, not elsewhere classified  Acute pain of right shoulder  Cramp and spasm     Problem List There are no problems to display for this patient.  Amador Cunas, PT, DPT Shelia Fuentes 06/08/2020, 3:32 PM  [place] [address] [place], [place], [address] Phone: [phone]   Fax:  [phone]  Name: Shelia Fuentes MRN: [id number] Date of Birth: [birth year]

## 2020-06-14 ENCOUNTER — Ambulatory Visit: Payer: Medicare Other | Admitting: Physical Therapy

## 2020-06-14 ENCOUNTER — Other Ambulatory Visit: Payer: Self-pay

## 2020-06-14 ENCOUNTER — Encounter: Payer: Self-pay | Admitting: Physical Therapy

## 2020-06-14 DIAGNOSIS — M25611 Stiffness of right shoulder, not elsewhere classified: Secondary | ICD-10-CM | POA: Diagnosis not present

## 2020-06-14 DIAGNOSIS — M25511 Pain in right shoulder: Secondary | ICD-10-CM

## 2020-06-14 DIAGNOSIS — R252 Cramp and spasm: Secondary | ICD-10-CM

## 2020-06-14 NOTE — Therapy (Signed)
[facility] [address] [place], [place], [address] Phone: [phone]   Fax:  [phone]  Physical Therapy Treatment  Patient Details  Name: Shelia Fuentes MRN: [id number] Date of Birth: [birth year] Referring Provider (PT): [place]   Encounter Date: 06/14/2020   PT End of Session - 06/14/20 1533    Visit Number 20    Date for PT Re-Evaluation 07/16/20    PT Start Time 1440    PT Stop Time 1525    PT Time Calculation (min) 45 min    Activity Tolerance Patient tolerated treatment well    Behavior During Therapy [facility] for tasks assessed/performed           History reviewed. No pertinent past medical history.  History reviewed. No pertinent surgical history.  There were no vitals filed for this visit.   Subjective Assessment - 06/14/20 1441    Subjective Pt reports she is feeling good overall    Currently in Pain? No/denies    Pain Score 0-No pain    Pain Location Shoulder    Pain Orientation Right              [facility] PT Assessment - 06/14/20 0001      AROM   Overall AROM Comments still limited in functional R IR/ER    Right Shoulder Flexion 130 Degrees    Right Shoulder ABduction 115 Degrees                         [facility] Adult PT Treatment/Exercise - 06/14/20 0001      Neck Exercises: Machines for Strengthening   UBE (Upper Arm Bike) L 3 min fwd/ 3 min back    Cybex Row 25# 2x15    Cybex Chest Press 10# 2x15    Lat Pull 25# 2x15    Other Machines for Strengthening shoulder ext 10# 2x10    Other Machines for Strengthening tricep extension 15# 2x15, bicep curls 10# 2x15      Shoulder Exercises: Standing   External Rotation 20 reps;Right;Strengthening    External Rotation Weight (lbs) 5    Internal Rotation Strengthening;Right;20 reps    Internal Rotation Weight (lbs) 5    Flexion AROM;Strengthening;Both;20 reps;Weights    Shoulder Flexion Weight (lbs) 2    ABduction  AROM;Strengthening;Both;20 reps    Shoulder ABduction Weight (lbs) 2    Other Standing Exercises shelf taps 2# flexion/abduction 1x10    Other Standing Exercises wall slide 3# weight abduction x10                    PT Short Term Goals - 04/07/20 1659      PT SHORT TERM GOAL #1   Title independent with initial HEP    Time 2    Period Weeks    Status Achieved             PT Long Term Goals - 06/14/20 1452      PT LONG TERM GOAL #1   Title understand postrure and body mechanics    Status Achieved      PT LONG TERM GOAL #2   Title decrease pain 50%    Baseline 95%    Status Achieved      PT LONG TERM GOAL #3   Title report able to dress and do hair without difficulty    Status Achieved      PT LONG TERM GOAL #4  Title increase flexion to 120 degrees    Baseline 130    Status Achieved                 Plan - 06/14/20 1533    Clinical Impression Statement Pt required cuing to avoid compensation with standing IR/ER. Pt has met the majority of her goals; still has some difficulty with functional shoulder IR/ER. Pt states that she is happy with current progress and would like to discontinue PT barring report from MD tomorrow 6/23.    PT Treatment/Interventions ADLs/Self Care Home Management;Cryotherapy;Electrical Stimulation;Moist Heat;Therapeutic activities;Therapeutic exercise;Manual techniques;Patient/family education    PT Next Visit Plan keep chart open until pt determines with MD whether she would like to continue PT    Consulted and Agree with Plan of Care Patient           Patient will benefit from skilled therapeutic intervention in order to improve the following deficits and impairments:  Pain, Improper body mechanics, Increased muscle spasms, Postural dysfunction, Impaired UE functional use, Decreased strength, Decreased range of motion, Decreased activity tolerance  Visit Diagnosis: Stiffness of right shoulder, not elsewhere  classified  Acute pain of right shoulder  Cramp and spasm     Problem List There are no problems to display for this patient.  Amador Cunas, PT, DPT Donald Prose Johnni Wunschel 06/14/2020, 3:35 PM  [address] [place], [place], [address] Phone: [phone]   Fax:  [phone]  Name: Shelia Fuentes MRN: [id number] Date of Birth: [birth year]

## 2020-06-15 ENCOUNTER — Encounter: Payer: Medicare Other | Admitting: Physical Therapy

## 2020-11-28 IMAGING — DX DG HUMERUS 2V *R*
2 series · 3 of 3 positions shown · non-contrast
Comparison: No prior.

CLINICAL DATA: Fall.

EXAM:
RIGHT HUMERUS - 2+ VIEW

[Series 1: humerus ap · 0.14mm/px · 2 of 2 slices shown]
[im 1/2]
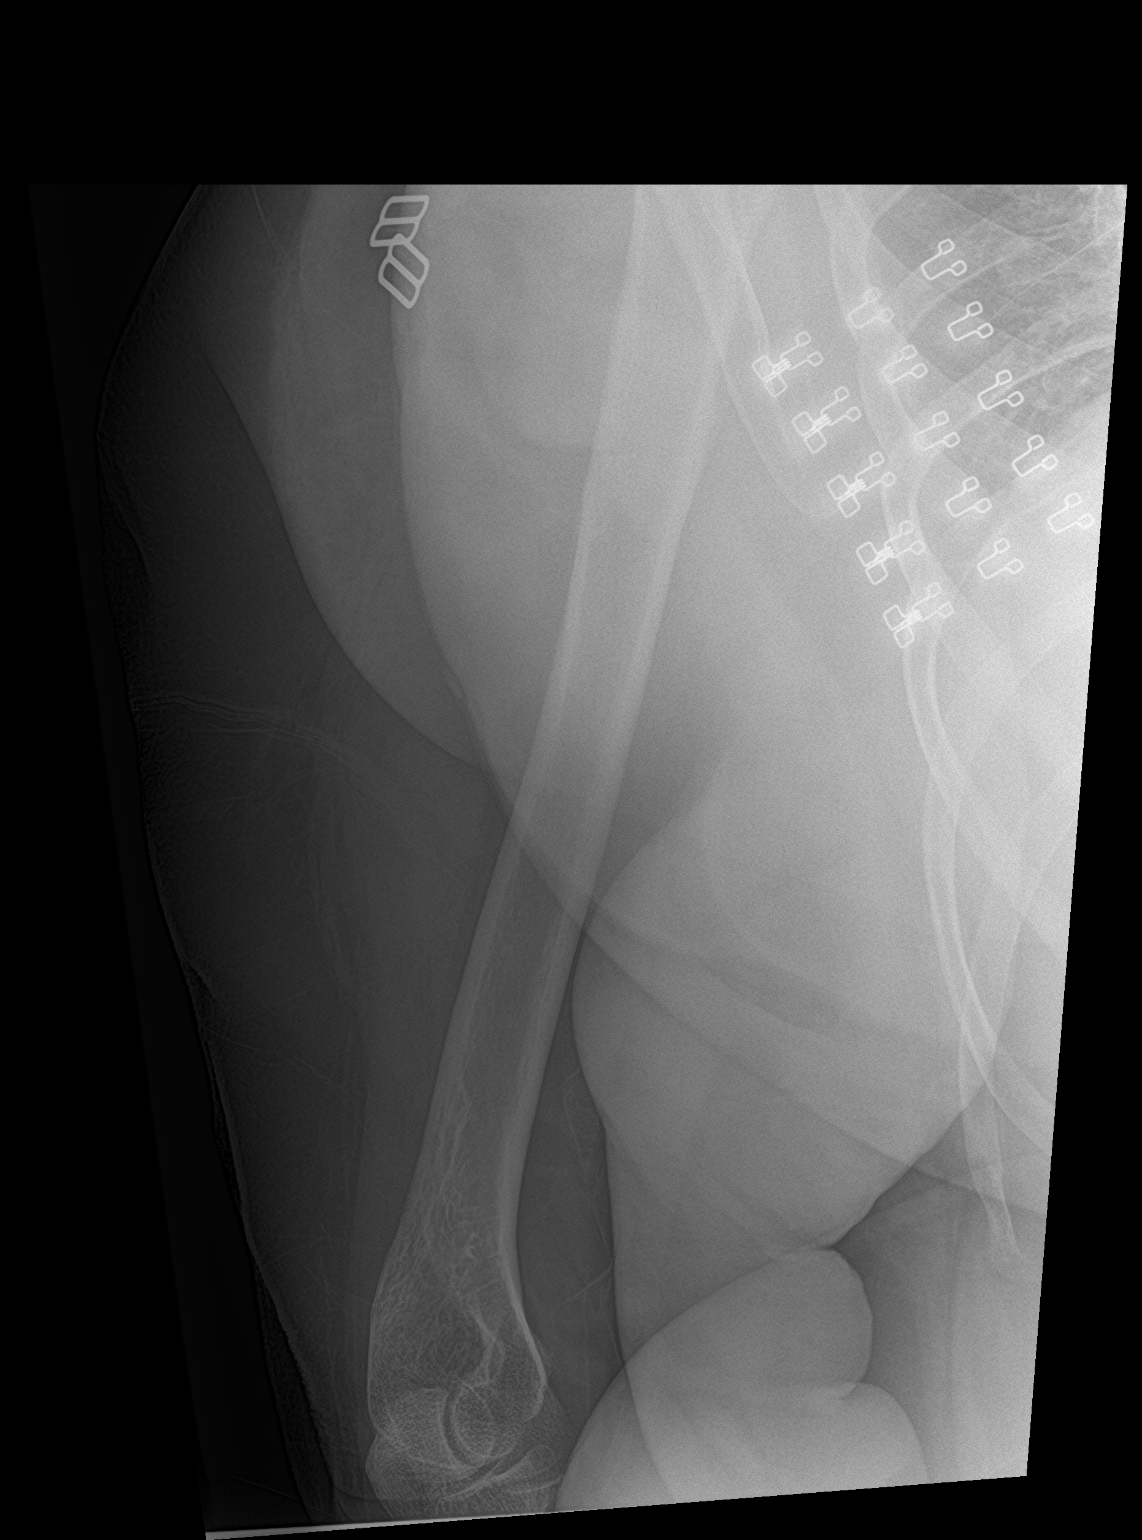
[im 2/2]
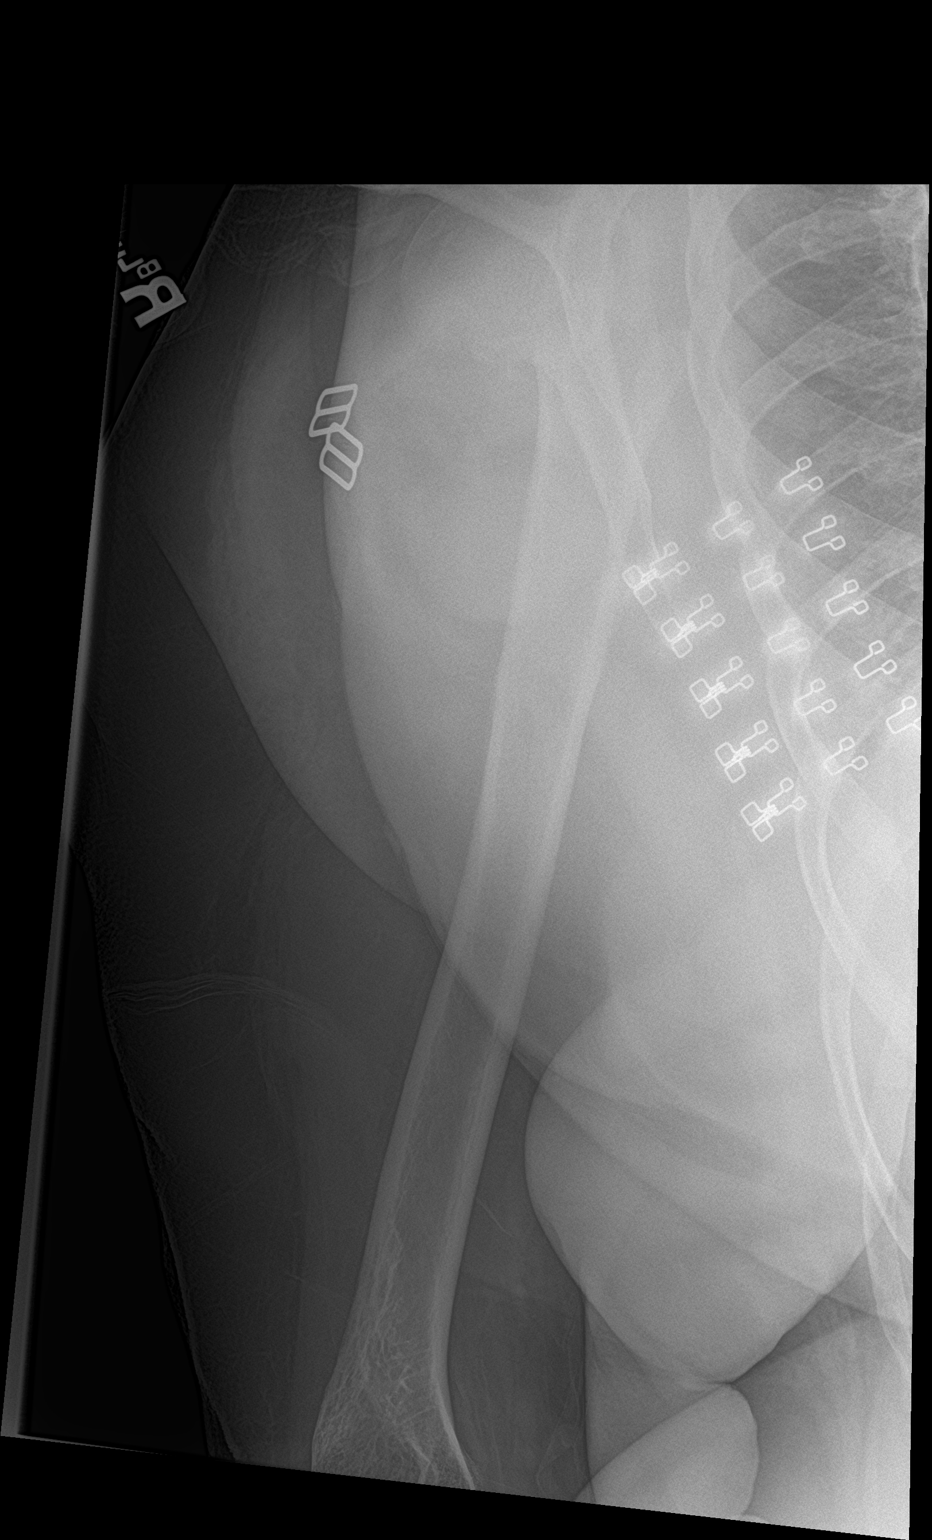

[humerus lat]
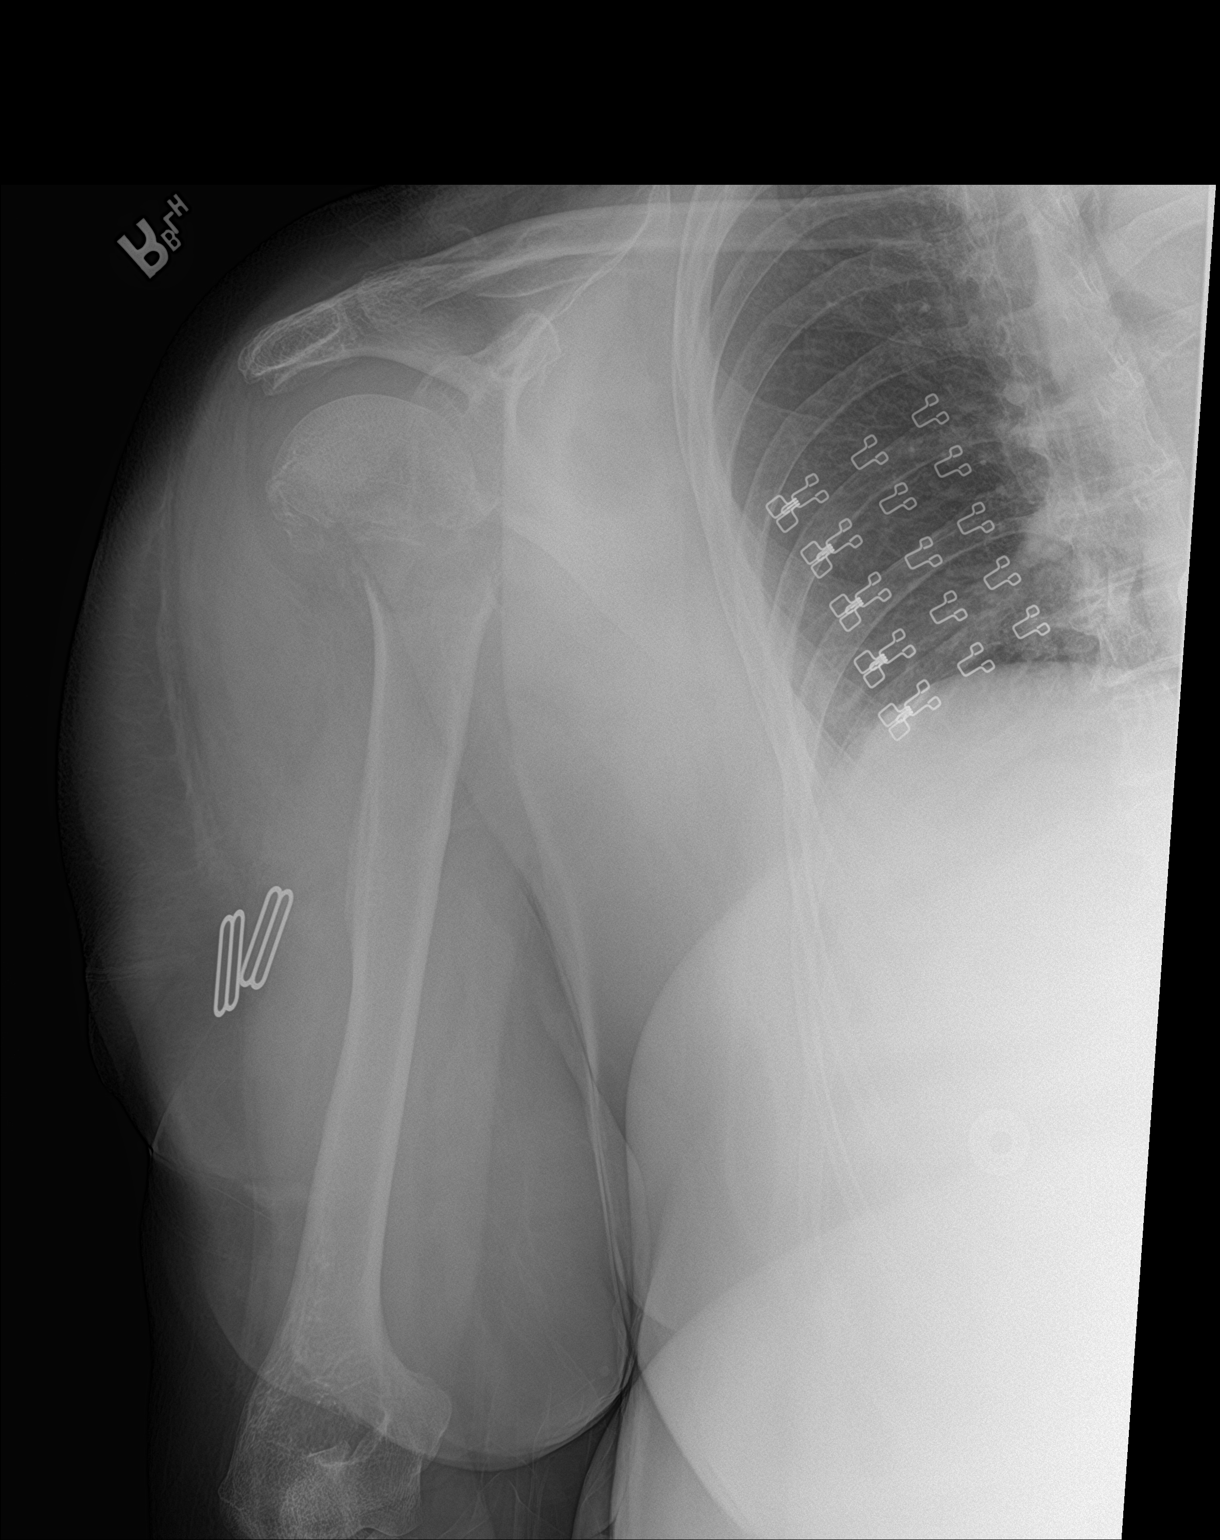

[3 of 3 positions shown; findings below may reference images not displayed]

FINDINGS: Angulated comminuted fracture of the proximal portion of the right
humerus is present. Right shoulder appears to be normally located.
No evidence of AC separation.
IMPRESSION: Angulated comminuted fracture of the proximal portion of the right
humerus.

## 2020-11-28 IMAGING — CR DG CLAVICLE*R*
2 series · 2 of 2 positions shown · non-contrast
Comparison: Right humerus series earlier today.

CLINICAL DATA: 73-year-old female status post fall last night.

EXAM:
RIGHT CLAVICLE - 2+ VIEWS

[clavicle ap]
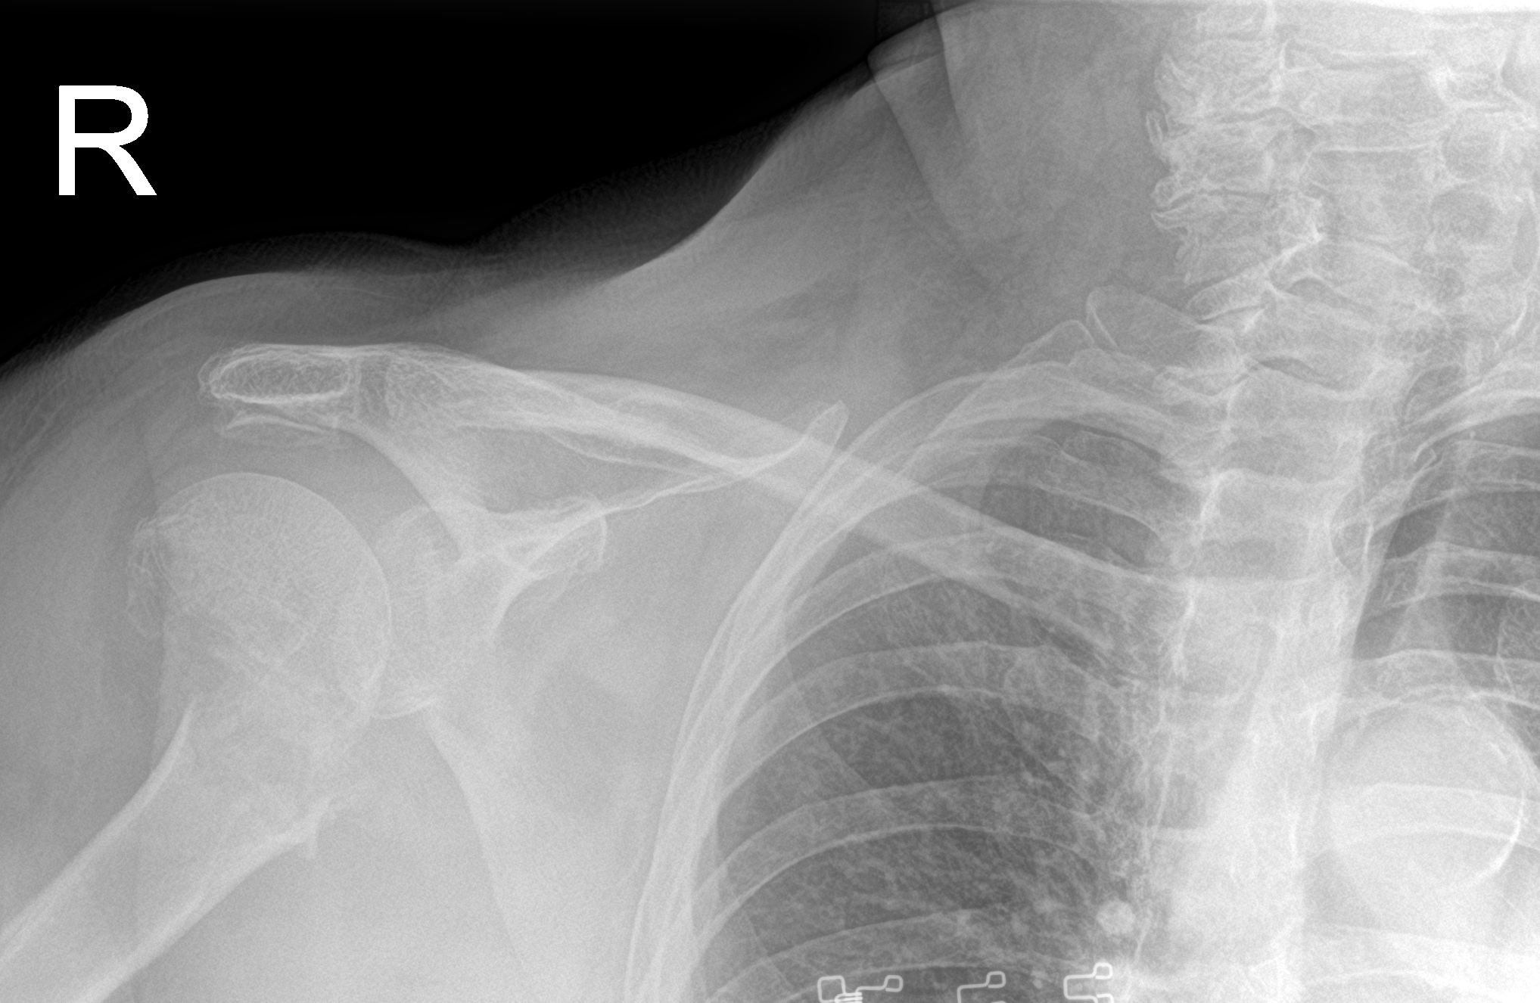

[clavicle axial]
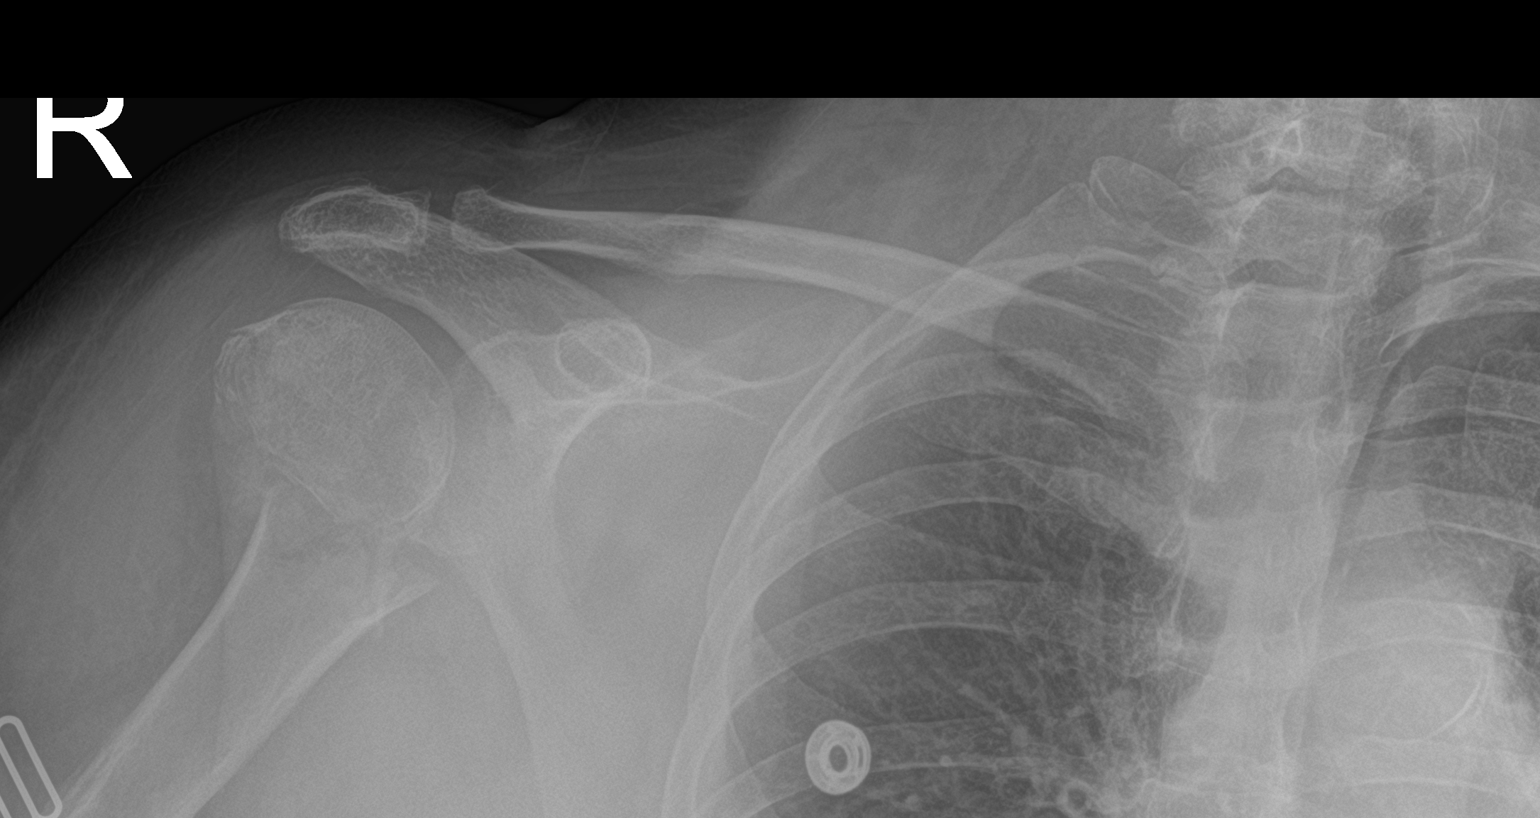

[2 of 2 positions shown; findings below may reference images not displayed]

FINDINGS: Comminuted proximal right humerus fracture as seen earlier. Right
clavicle appears intact. Negative visible right ribs and chest.
Advanced cervical spine facet arthropathy partially visible.
IMPRESSION: 1. Right clavicle appears intact.
2. Comminuted proximal humerus fracture.
